# Patient Record
Sex: Female | Born: 1960
Health system: Southern US, Community
[De-identification: ages and names within clinical notes are randomized; demographics above are authoritative.]

## PROBLEM LIST (undated history)

## (undated) DIAGNOSIS — E7849 Other hyperlipidemia: Secondary | ICD-10-CM

## (undated) DIAGNOSIS — K921 Melena: Secondary | ICD-10-CM

## (undated) DIAGNOSIS — F339 Major depressive disorder, recurrent, unspecified: Secondary | ICD-10-CM

## (undated) DIAGNOSIS — I499 Cardiac arrhythmia, unspecified: Secondary | ICD-10-CM

## (undated) DIAGNOSIS — F419 Anxiety disorder, unspecified: Secondary | ICD-10-CM

## (undated) DIAGNOSIS — Z79899 Other long term (current) drug therapy: Secondary | ICD-10-CM

## (undated) DIAGNOSIS — B019 Varicella without complication: Secondary | ICD-10-CM

## (undated) DIAGNOSIS — J189 Pneumonia, unspecified organism: Secondary | ICD-10-CM

## (undated) DIAGNOSIS — G43909 Migraine, unspecified, not intractable, without status migrainosus: Secondary | ICD-10-CM

## (undated) DIAGNOSIS — I341 Nonrheumatic mitral (valve) prolapse: Secondary | ICD-10-CM

## (undated) DIAGNOSIS — M1990 Unspecified osteoarthritis, unspecified site: Secondary | ICD-10-CM

## (undated) DIAGNOSIS — T8859XA Other complications of anesthesia, initial encounter: Secondary | ICD-10-CM

## (undated) DIAGNOSIS — K219 Gastro-esophageal reflux disease without esophagitis: Secondary | ICD-10-CM

## (undated) DIAGNOSIS — K5190 Ulcerative colitis, unspecified, without complications: Secondary | ICD-10-CM

## (undated) DIAGNOSIS — T4145XA Adverse effect of unspecified anesthetic, initial encounter: Secondary | ICD-10-CM

## (undated) DIAGNOSIS — F4321 Adjustment disorder with depressed mood: Secondary | ICD-10-CM

## (undated) HISTORY — PX: COLONOSCOPY: SHX174

## (undated) HISTORY — DX: Major depressive disorder, recurrent, unspecified: F33.9

## (undated) HISTORY — PX: WISDOM TOOTH EXTRACTION: SHX21

## (undated) HISTORY — DX: Adjustment disorder with depressed mood: F43.21

## (undated) HISTORY — DX: Varicella without complication: B01.9

## (undated) HISTORY — DX: Melena: K92.1

## (undated) HISTORY — DX: Gastro-esophageal reflux disease without esophagitis: K21.9

## (undated) HISTORY — DX: Cardiac arrhythmia, unspecified: I49.9

## (undated) HISTORY — DX: Migraine, unspecified, not intractable, without status migrainosus: G43.909

## (undated) HISTORY — DX: Other long term (current) drug therapy: Z79.899

---
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
## 1999-07-21 ENCOUNTER — Other Ambulatory Visit: Admission: RE | Admit: 1999-07-21 | Discharge: 1999-07-21 | Payer: Self-pay | Admitting: Gynecology

## 2000-08-21 ENCOUNTER — Other Ambulatory Visit: Admission: RE | Admit: 2000-08-21 | Discharge: 2000-08-21 | Payer: Self-pay | Admitting: Obstetrics and Gynecology

## 2002-01-28 ENCOUNTER — Other Ambulatory Visit: Admission: RE | Admit: 2002-01-28 | Discharge: 2002-01-28 | Payer: Self-pay | Admitting: Obstetrics and Gynecology

## 2003-05-04 ENCOUNTER — Other Ambulatory Visit: Admission: RE | Admit: 2003-05-04 | Discharge: 2003-05-04 | Payer: Self-pay | Admitting: Obstetrics and Gynecology

## 2004-06-13 ENCOUNTER — Emergency Department (HOSPITAL_COMMUNITY): Admission: EM | Admit: 2004-06-13 | Discharge: 2004-06-13 | Payer: Self-pay | Admitting: Family Medicine

## 2005-06-30 ENCOUNTER — Ambulatory Visit (HOSPITAL_COMMUNITY): Admission: RE | Admit: 2005-06-30 | Discharge: 2005-06-30 | Payer: Self-pay | Admitting: Surgery

## 2005-06-30 ENCOUNTER — Encounter (INDEPENDENT_AMBULATORY_CARE_PROVIDER_SITE_OTHER): Payer: Self-pay | Admitting: *Deleted

## 2005-06-30 ENCOUNTER — Ambulatory Visit (HOSPITAL_BASED_OUTPATIENT_CLINIC_OR_DEPARTMENT_OTHER): Admission: RE | Admit: 2005-06-30 | Discharge: 2005-06-30 | Payer: Self-pay | Admitting: Surgery

## 2006-06-08 ENCOUNTER — Encounter: Admission: RE | Admit: 2006-06-08 | Discharge: 2006-06-08 | Payer: Self-pay | Admitting: Occupational Medicine

## 2008-10-09 HISTORY — PX: BREAST BIOPSY: SHX20

## 2008-11-05 ENCOUNTER — Emergency Department (HOSPITAL_COMMUNITY): Admission: EM | Admit: 2008-11-05 | Discharge: 2008-11-05 | Payer: Self-pay | Admitting: Family Medicine

## 2009-02-19 ENCOUNTER — Ambulatory Visit (HOSPITAL_COMMUNITY): Admission: RE | Admit: 2009-02-19 | Discharge: 2009-02-19 | Payer: Self-pay | Admitting: Obstetrics and Gynecology

## 2009-06-14 ENCOUNTER — Emergency Department (HOSPITAL_COMMUNITY): Admission: EM | Admit: 2009-06-14 | Discharge: 2009-06-14 | Payer: Self-pay | Admitting: Family Medicine

## 2010-05-26 ENCOUNTER — Encounter: Admission: RE | Admit: 2010-05-26 | Discharge: 2010-07-06 | Payer: Self-pay | Admitting: Physician Assistant

## 2010-06-15 ENCOUNTER — Ambulatory Visit (HOSPITAL_COMMUNITY): Admission: RE | Admit: 2010-06-15 | Discharge: 2010-06-15 | Payer: Self-pay | Admitting: Orthopedic Surgery

## 2010-06-23 ENCOUNTER — Encounter: Admission: RE | Admit: 2010-06-23 | Discharge: 2010-06-23 | Payer: Self-pay | Admitting: Obstetrics and Gynecology

## 2010-10-05 ENCOUNTER — Emergency Department (HOSPITAL_COMMUNITY)
Admission: EM | Admit: 2010-10-05 | Discharge: 2010-10-05 | Payer: Self-pay | Source: Home / Self Care | Admitting: Family Medicine

## 2010-12-12 ENCOUNTER — Other Ambulatory Visit: Payer: Self-pay | Admitting: Obstetrics and Gynecology

## 2010-12-12 DIAGNOSIS — Z09 Encounter for follow-up examination after completed treatment for conditions other than malignant neoplasm: Secondary | ICD-10-CM

## 2010-12-19 LAB — POCT URINALYSIS DIPSTICK
Bilirubin Urine: NEGATIVE
Glucose, UA: NEGATIVE mg/dL
Ketones, ur: NEGATIVE mg/dL
Nitrite: NEGATIVE
Protein, ur: NEGATIVE mg/dL
Specific Gravity, Urine: <=1.005 (ref 1.005–1.030)
Urobilinogen, UA: 0.2 mg/dL (ref 0.0–1.0)
pH: 5.5 (ref 5.0–8.0)

## 2010-12-19 LAB — URINE CULTURE
Colony Count: NO GROWTH
Culture  Setup Time: 201112281907
Culture: NO GROWTH

## 2010-12-26 ENCOUNTER — Other Ambulatory Visit: Payer: Self-pay

## 2010-12-28 ENCOUNTER — Ambulatory Visit (INDEPENDENT_AMBULATORY_CARE_PROVIDER_SITE_OTHER): Payer: 59 | Admitting: Licensed Clinical Social Worker

## 2010-12-28 DIAGNOSIS — F411 Generalized anxiety disorder: Secondary | ICD-10-CM

## 2010-12-28 DIAGNOSIS — F331 Major depressive disorder, recurrent, moderate: Secondary | ICD-10-CM

## 2011-01-10 ENCOUNTER — Other Ambulatory Visit: Payer: Self-pay

## 2011-01-11 ENCOUNTER — Other Ambulatory Visit: Payer: Self-pay

## 2011-01-13 LAB — POCT PREGNANCY, URINE: Preg Test, Ur: NEGATIVE

## 2011-01-13 LAB — POCT URINALYSIS DIP (DEVICE)
Bilirubin Urine: NEGATIVE
Glucose, UA: NEGATIVE mg/dL
Ketones, ur: NEGATIVE mg/dL
Nitrite: NEGATIVE
Protein, ur: NEGATIVE mg/dL
Specific Gravity, Urine: 1.015 (ref 1.005–1.030)
Urobilinogen, UA: 0.2 mg/dL (ref 0.0–1.0)
pH: 6.5 (ref 5.0–8.0)

## 2011-01-13 LAB — URINE CULTURE: Colony Count: 7000

## 2011-02-24 NOTE — Op Note (Signed)
Lacey Leon, Lacey Leon                ACCOUNT NO.:  [id number]   MEDICAL RECORD NO.:  [id number]          PATIENT TYPE:  AMB   LOCATION:  [place]                          FACILITY:  [facility]   PHYSICIAN:  Haywood Lasso, M.D.DATE OF BIRTH:  04-Nov-1960   DATE OF PROCEDURE:  06/30/2005  DATE OF DISCHARGE:                                 OPERATIVE REPORT   OFFICE MEDICAL RECORD NUMBER:  [facility] [phone].   PREOPERATIVE DIAGNOSIS:  Right breast mass with inconclusive core biopsy.   POSTOPERATIVE DIAGNOSIS:  Right breast mass with inconclusive core biopsy.   OPERATION:  Excision right breast mass.   SURGEON:  Haywood Lasso, M.D.   ANESTHESIA:  MAC.   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 Excisional biopsy was suggested to clarify the diagnosis.   DESCRIPTION OF PROCEDURE:  The patient was seen in the holding area, and she  had no further questions. She had confirmed and marked the right breast as  the operative site, and I also did so.   The patient was taken to the operating room, and the mass itself palpated,  identified with ultrasound and marked. The patient's was given IV sedation  and the breast prepped and draped. The time-out occurred.   I infiltrated a combination of plain 1% Xylocaine and 0.5% Marcaine mixed  equally, circumareolarly and around the mass. I made a curvilinear  circumareolar incision at the areolar margin superiorly. I was able to  palpate the mass readily and I put a suture through it for traction. Using  the cautery, I then excised it with a small rim, of what appeared to be,  normal tissue completely around it.   Once this was out, I made sure everything was dry and then closed the breast  with some 3-0 Vicryl, 4-0 Monocryl subcuticular, and Dermabond on the skin.   The patient tolerated the procedure well. There were no  operative  complications. All counts were correct.      Haywood Lasso, M.D.  Electronically Signed     CJS/MEDQ  D:  06/30/2005  T:  07/01/2005  Job:  [id number]   cc:   Sherry A. Irven [place], M.D.  Fax: [phone]   Johnnette Gourd, M.D.

## 2015-12-22 DIAGNOSIS — G43909 Migraine, unspecified, not intractable, without status migrainosus: Secondary | ICD-10-CM | POA: Insufficient documentation

## 2015-12-22 DIAGNOSIS — K5190 Ulcerative colitis, unspecified, without complications: Secondary | ICD-10-CM | POA: Insufficient documentation

## 2015-12-22 DIAGNOSIS — F411 Generalized anxiety disorder: Secondary | ICD-10-CM | POA: Insufficient documentation

## 2015-12-22 HISTORY — DX: Migraine, unspecified, not intractable, without status migrainosus: G43.909

## 2015-12-23 DIAGNOSIS — F418 Other specified anxiety disorders: Secondary | ICD-10-CM | POA: Insufficient documentation

## 2016-01-07 DIAGNOSIS — F4321 Adjustment disorder with depressed mood: Secondary | ICD-10-CM | POA: Insufficient documentation

## 2016-01-07 HISTORY — DX: Adjustment disorder with depressed mood: F43.21

## 2016-02-16 ENCOUNTER — Telehealth: Payer: Self-pay | Admitting: Gastroenterology

## 2016-02-16 NOTE — Telephone Encounter (Signed)
Called back to the patient. No answer. Left message offering 02/28/16 at 10:30 am. Ask her to call back if this appointment will be helpful to her

## 2018-10-31 ENCOUNTER — Other Ambulatory Visit: Payer: Self-pay | Admitting: *Deleted

## 2018-11-01 ENCOUNTER — Encounter: Payer: Self-pay | Admitting: Family Medicine

## 2018-11-01 ENCOUNTER — Telehealth: Payer: Self-pay | Admitting: Emergency Medicine

## 2018-11-01 ENCOUNTER — Ambulatory Visit (INDEPENDENT_AMBULATORY_CARE_PROVIDER_SITE_OTHER): Payer: No Typology Code available for payment source | Admitting: Family Medicine

## 2018-11-01 ENCOUNTER — Other Ambulatory Visit: Payer: Self-pay

## 2018-11-01 VITALS — BP 100/56 | HR 91 | Temp 98.5°F | Resp 14 | Ht 67.0 in | Wt 121.0 lb

## 2018-11-01 DIAGNOSIS — Z79899 Other long term (current) drug therapy: Secondary | ICD-10-CM

## 2018-11-01 DIAGNOSIS — K5190 Ulcerative colitis, unspecified, without complications: Secondary | ICD-10-CM

## 2018-11-01 DIAGNOSIS — R739 Hyperglycemia, unspecified: Secondary | ICD-10-CM

## 2018-11-01 DIAGNOSIS — D509 Iron deficiency anemia, unspecified: Secondary | ICD-10-CM | POA: Diagnosis not present

## 2018-11-01 DIAGNOSIS — F418 Other specified anxiety disorders: Secondary | ICD-10-CM | POA: Diagnosis not present

## 2018-11-01 DIAGNOSIS — F411 Generalized anxiety disorder: Secondary | ICD-10-CM

## 2018-11-01 DIAGNOSIS — F339 Major depressive disorder, recurrent, unspecified: Secondary | ICD-10-CM | POA: Diagnosis not present

## 2018-11-01 DIAGNOSIS — E559 Vitamin D deficiency, unspecified: Secondary | ICD-10-CM | POA: Diagnosis not present

## 2018-11-01 HISTORY — DX: Other long term (current) drug therapy: Z79.899

## 2018-11-01 HISTORY — DX: Major depressive disorder, recurrent, unspecified: F33.9

## 2018-11-01 LAB — CBC WITH DIFFERENTIAL/PLATELET
Basophils Absolute: 0 10*3/uL (ref 0.0–0.1)
Basophils Relative: 1.1 % (ref 0.0–3.0)
Eosinophils Absolute: 0.1 10*3/uL (ref 0.0–0.7)
Eosinophils Relative: 1.2 % (ref 0.0–5.0)
HCT: 41 % (ref 36.0–46.0)
Hemoglobin: 13.3 g/dL (ref 12.0–15.0)
Lymphocytes Relative: 29.3 % (ref 12.0–46.0)
Lymphs Abs: 1.3 10*3/uL (ref 0.7–4.0)
MCHC: 32.3 g/dL (ref 30.0–36.0)
MCV: 79.4 fl (ref 78.0–100.0)
Monocytes Absolute: 0.2 10*3/uL (ref 0.1–1.0)
Monocytes Relative: 4.7 % (ref 3.0–12.0)
Neutro Abs: 2.9 10*3/uL (ref 1.4–7.7)
Neutrophils Relative %: 63.7 % (ref 43.0–77.0)
Platelets: 164 10*3/uL (ref 150.0–400.0)
RBC: 5.16 Mil/uL — ABNORMAL HIGH (ref 3.87–5.11)
RDW: 13.6 % (ref 11.5–15.5)
WBC: 4.5 10*3/uL (ref 4.0–10.5)

## 2018-11-01 LAB — TSH: TSH: 0.98 u[IU]/mL (ref 0.35–4.50)

## 2018-11-01 LAB — VITAMIN D 25 HYDROXY (VIT D DEFICIENCY, FRACTURES): VITD: 38.2 ng/mL (ref 30.00–100.00)

## 2018-11-01 LAB — COMPREHENSIVE METABOLIC PANEL
ALT: 17 U/L (ref 0–35)
AST: 19 U/L (ref 0–37)
Albumin: 4.9 g/dL (ref 3.5–5.2)
Alkaline Phosphatase: 66 U/L (ref 39–117)
BUN: 11 mg/dL (ref 6–23)
CO2: 23 mEq/L (ref 19–32)
Calcium: 10 mg/dL (ref 8.4–10.5)
Chloride: 100 mEq/L (ref 96–112)
Creatinine, Ser: 0.74 mg/dL (ref 0.40–1.20)
GFR: 81.38 mL/min (ref >60.00)
Glucose, Bld: 97 mg/dL (ref 70–99)
Potassium: 4.1 mEq/L (ref 3.5–5.1)
Sodium: 134 mEq/L — ABNORMAL LOW (ref 135–145)
Total Bilirubin: 0.8 mg/dL (ref 0.2–1.2)
Total Protein: 7.7 g/dL (ref 6.0–8.3)

## 2018-11-01 LAB — HEMOGLOBIN A1C: Hgb A1c MFr Bld: 5.6 % (ref 4.6–6.5)

## 2018-11-01 MED ORDER — LORAZEPAM 1 MG PO TABS: 1.0000 mg | ORAL_TABLET | Freq: Two times a day (BID) | ORAL | 5 refills | Status: DC | PRN

## 2018-11-01 MED ORDER — SERTRALINE HCL 25 MG PO TABS: 25.0000 mg | ORAL_TABLET | Freq: Every day | ORAL | 1 refills | Status: DC

## 2018-11-01 MED FILL — SERTRALINE HCL 25 MG TABLET: 25 | 90 days supply | Qty: 90 | Fill #0

## 2018-11-01 MED FILL — LORazepam 1 MG TABS: 1 | 30 days supply | Qty: 60 | Fill #0

## 2018-11-01 NOTE — Telephone Encounter (Signed)
While patient was having labs drawn. She wanted to see if PCP will add a A1C to lab orders. She c/o of increased thirst.  Please advise. Lab did drawn additional tube for the test

## 2018-11-01 NOTE — Patient Instructions (Signed)
Please return in May 2020 for a complete physical. Please come fasting.   I have placed a referral for you to see Dr. Collene Mares again.  I have placed a referral for psychiatry as well; however, below are several resources for you to schedule for psychiatry and counseling.  It was a pleasure meeting you today! Thank you for choosing [place] to meet your healthcare needs! I truly look forward to working with you. If you have any questions or concerns, please send me a message via Mychart or call the office at [phone].  Psychiatrists  [facility] [address] [place], [place] [address] [phone]  [place], NP [address] [place], [place] [address] [place], [place] [address] [phone] [facility] Pauline Good, NP Magda Paganini NP [place], [address] [place], [place] [address] [phone]  Counseling centers only: Please call [facility] to schedule an appointment with Dr. Laroy Apple; she works at our office, or to schedule with a different female therapist.  The phone number is: [phone]  [facility] [address] [place], [place] [facility]: [facility] [address] [place] [address] [place], [place] [phone]   [facility] [address], [place] [address] [place], [place] [address] [place]  [facility] [address] [place], [place] [address] [phone]

## 2018-11-01 NOTE — Telephone Encounter (Signed)
Test added.   

## 2018-11-01 NOTE — Progress Notes (Signed)
Subjective  CC:  Chief Complaint  Patient presents with  . Establish Care    Previous pt of Dr. Redmond Pulling, last CPE was 2018  . Anxiety    Taking Zoloft and Lorazepam but reports situational problems causing increase  . Depression    Taking Zoloft and Lorazepam but reports situational problems causing increase  . Weight Loss    Clothes are not fitting well anymore  . Ulcerative Colitis    Using Canasa Suppositories, feels like she is having more flare ups    HPI: Lacey Leon is a 58 y.o. female who presents to [place] at [facility] today to establish care with me as a new patient. I have reviewed old records from prior PCP.  She has the following concerns or needs:  Mood disorder: has been treated for depression and anxiety since age 58 with zoloft and ativan multiple times per day. Reports she is very anxious. No SI or panic attacks. Never has been hospitalized for mood. Feels alone: divorced, no children, and family lives back in [place] and [place]. No social support group. She does love her job but worries her anxiety will make her lose it. Has been to psych and therapy many years ago. Ready to go again.   UC: last seen by Dr. Collene Mares in 2017. Was to have f/u but never got scheduled. Reports she is due for colonoscopy. Has frequent diarrhea. Has been losing weight; flared by anxiety.   Reports h/o chronic iron deficiency and intermittent anemia. Postmenopausal. ? Due to poor absorption. Feel fatigued and would like it rechecked. No melena. occ blood in stool  H/o thirsty a lot. No h/o diabetes.   H/o vit D deficiency. Little sunlight exposure. No leg cramps.   Dependent on benzos since use x 30 years from prior pcp. She is fearful of weaning.   Depression screen PHQ 2/9 11/01/2018  Decreased Interest 1  Down, Depressed, Hopeless 1  PHQ - 2 Score 2  Altered sleeping 1  Tired, decreased energy 1  Change in appetite 1  Feeling bad or failure about yourself  1    Trouble concentrating 1  Moving slowly or fidgety/restless 1  Suicidal thoughts 0  PHQ-9 Score 8  Difficult doing work/chores Somewhat difficult   GAD 7 : Generalized Anxiety Score 11/01/2018  Nervous, Anxious, on Edge 3  Control/stop worrying 3  Worry too much - different things 3  Trouble relaxing 3  Restless 0  Easily annoyed or irritable 1  Afraid - awful might happen 2  Total GAD 7 Score 15  Anxiety Difficulty Somewhat difficult     Assessment  1. Generalized anxiety disorder   2. Major depression, recurrent, chronic ([place])   3. Chronic prescription benzodiazepine use   4. Situational anxiety   5. Ulcerative colitis without complications, unspecified location ([place])   6. Chronic iron deficiency anemia   7. Vitamin D deficiency   8. Hyperglycemia      Plan   Mood disorder: very active. Counseling done. Needs medication adjustment. Refer to psych and for therapy.   Refer back to GI for UC care.   Check lab work and treat iron def and vit D defic if indicated.   Due cpe in may.   Follow up:  Return in about 4 months (around 03/02/2019) for complete physical. Orders Placed This Encounter  Procedures  . TSH  . Iron, TIBC and Ferritin Panel  . CBC with Differential/Platelet  . Comprehensive metabolic panel  .  VITAMIN D 25 Hydroxy (Vit-D Deficiency, Fractures)  . Hemoglobin A1c  . Ambulatory referral to Psychiatry  . Ambulatory referral to Gastroenterology   Meds ordered this encounter  Medications  . LORazepam (ATIVAN) 1 MG tablet    Sig: Take 1 tablet (1 mg total) by mouth 2 (two) times daily as needed for anxiety.    Dispense:  60 tablet    Refill:  5  . sertraline (ZOLOFT) 25 MG tablet    Sig: Take 1 tablet (25 mg total) by mouth daily.    Dispense:  90 tablet    Refill:  1       We updated and reviewed the patient's past history in detail and it is documented below.  Patient Active Problem List   Diagnosis Date Noted  . Major depression,  recurrent, chronic ([place]) 11/01/2018    On zoloft since age 48   . Chronic prescription benzodiazepine use 11/01/2018  . Situational anxiety 12/23/2015  . Generalized anxiety disorder 12/22/2015  . Migraine headache 12/22/2015  . Ulcerative colitis ([place]) 12/22/2015   Health Maintenance  Topic Date Due  . Hepatitis C Screening  [birth year]  . HIV Screening  07/02/1978  . PAP SMEAR-Modifier  07/02/1984  . MAMMOGRAM  06/24/2011  . COLONOSCOPY  11/01/2025  . TETANUS/TDAP  09/13/2028  . INFLUENZA VACCINE  Completed    There is no immunization history on file for this patient. Current Meds  Medication Sig  . LORazepam (ATIVAN) 1 MG tablet Take 1 tablet (1 mg total) by mouth 2 (two) times daily as needed for anxiety.  . mesalamine (CANASA) 1000 MG suppository Place 1,000 mg rectally at bedtime.  . sertraline (ZOLOFT) 25 MG tablet Take 1 tablet (25 mg total) by mouth daily.  . [DISCONTINUED] LORazepam (ATIVAN) 1 MG tablet Take one tablet three times a day as needed for anxiety.  . [DISCONTINUED] sertraline (ZOLOFT) 25 MG tablet Take one tablets daily for anxiety.    Allergies: Patient is allergic to clarithromycin. Past Medical History Patient  has a past medical history of Arrhythmia, Blood in stool, Chicken pox, Chronic prescription benzodiazepine use (11/01/2018), GERD (gastroesophageal reflux disease), Major depression, recurrent, chronic ([place]) (11/01/2018), Migraine, and Reaction, adjustment, with depressed mood, prolonged (01/07/2016). Past Surgical History Patient  has a past surgical history that includes Breast biopsy (2010). Family History: Patient family history includes Depression in her mother; Heart disease in her father. Social History:  Patient  reports that she has never smoked. She has never used smokeless tobacco. She reports that she does not drink alcohol or use drugs.  Review of Systems: Constitutional: negative for fever or malaise Ophthalmic: negative for  photophobia, double vision or loss of vision Cardiovascular: negative for chest pain, dyspnea on exertion, or new LE swelling Respiratory: negative for SOB or persistent cough Gastrointestinal: negative for abdominal pain, change in bowel habits or melena Genitourinary: negative for dysuria or gross hematuria Musculoskeletal: negative for new gait disturbance or muscular weakness Integumentary: negative for new or persistent rashes Neurological: negative for TIA or stroke symptoms Psychiatric: negative for SI or delusions Allergic/Immunologic: negative for hives  Patient Care Team    Relationship Specialty Notifications Start End  Leamon Arnt, MD PCP - General Family Medicine  11/01/18   Juanita Craver, MD Consulting Physician Gastroenterology  11/01/18   [facility]    11/01/18     Objective  Vitals: BP (!) 100/56   Pulse 91   Temp 98.5 F (36.9 C) (Oral)  Resp 14   Ht 5' 7"  (8.850 m)   Wt 121 lb (54.9 kg)   SpO2 98%   BMI 18.95 kg/m  General:  Well developed, well nourished, no acute distress but tearful throughout interview Psych:  Alert and oriented,normal mood and affect HEENT:  Normocephalic, atraumatic, non-icteric sclera, PERRL, oropharynx is without mass or exudate, supple neck without adenopathy, mass or thyromegaly Cardiovascular:  RRR without gallop, rub or murmur, nondisplaced PMI Respiratory:  Good breath sounds bilaterally, CTAB with normal respiratory effort Gastrointestinal: normal bowel sounds, soft, non-tender, no noted masses. No HSM MSK: no deformities, contusions. Joints are without erythema or swelling Skin:  Warm, no rashes or suspicious lesions noted Neurologic:    Mental status is normal. Gross motor and sensory exams are normal. Normal gait   Commons side effects, risks, benefits, and alternatives for medications and treatment plan prescribed today were discussed, and the patient expressed understanding of the given instructions.  Patient is instructed to call or message via MyChart if he/she has any questions or concerns regarding our treatment plan. No barriers to understanding were identified. We discussed Red Flag symptoms and signs in detail. Patient expressed understanding regarding what to do in case of urgent or emergency type symptoms.   Medication list was reconciled, printed and provided to the patient in AVS. Patient instructions and summary information was reviewed with the patient as documented in the AVS. This note was prepared with assistance of Dragon voice recognition software. Occasional wrong-word or sound-a-like substitutions may have occurred due to the inherent limitations of voice recognition software

## 2018-11-01 NOTE — Telephone Encounter (Signed)
Sure. thanks

## 2018-11-02 LAB — IRON,TIBC AND FERRITIN PANEL
%SAT: 54 % (calc) — ABNORMAL HIGH (ref 16–45)
Ferritin: 43 ng/mL (ref 16–232)
Iron: 212 ug/dL — ABNORMAL HIGH (ref 45–160)
TIBC: 395 mcg/dL (calc) (ref 250–450)

## 2018-11-04 ENCOUNTER — Encounter: Payer: Self-pay | Admitting: Family Medicine

## 2018-11-06 ENCOUNTER — Encounter: Payer: Self-pay | Admitting: Family Medicine

## 2018-11-07 MED FILL — MESALAMINE 1000 MG SUPP: 1000 | 30 days supply | Qty: 30 | Fill #0

## 2018-12-06 ENCOUNTER — Ambulatory Visit: Payer: No Typology Code available for payment source | Admitting: Psychology

## 2018-12-09 ENCOUNTER — Encounter: Payer: Self-pay | Admitting: Family Medicine

## 2018-12-20 MED FILL — SERTRALINE HCL 100 MG TAB: 100 | 30 days supply | Qty: 30 | Fill #0 | Status: TO

## 2018-12-24 MED FILL — LORazepam 1 MG TABS: 1 | 30 days supply | Qty: 60 | Fill #1 | Status: TO

## 2018-12-30 ENCOUNTER — Ambulatory Visit: Payer: Self-pay | Admitting: *Deleted

## 2018-12-30 NOTE — Telephone Encounter (Signed)
Pt called stating that she has a cough, and intermittent chest discomfort due to coughing;she has taken mucinex; she also has post nasal drip; her secretions are clear; her eyes have been watering; the pt feels like her symptoms are related to her allergies; however, she was told to contact her PCP, the pt says "I feel fine"; she says that her cough starts when she talks a lot;   Recommendations made per nurse triage protocol; she verbalizes understanding, she does not want to come into the office, but she needs a note for work; please contact the pt at [phone], and a message can be left on the voicemail; the pt is seen by Dr Jonni Sanger, [facility]; will route to office for notification. Reason for Disposition . Cough with cold symptoms (e.g., runny nose, postnasal drip, throat clearing)  Answer Assessment - Initial Assessment Questions 1. ONSET: "When did the cough begin?"      12/22/21 2. SEVERITY: "How bad is the cough today?"      Mild; worse when talking 3. RESPIRATORY DISTRESS: "Describe your breathing."      no 4. FEVER: "Do you have a fever?" If so, ask: "What is your temperature, how was it measured, and when did it start?"     no 5. SPUTUM: "Describe the color of your sputum" (clear, white, yellow, green)     clear 6. HEMOPTYSIS: "Are you coughing up any blood?" If so ask: "How much?" (flecks, streaks, tablespoons, etc.)     no 7. CARDIAC HISTORY: "Do you have any history of heart disease?" (e.g., heart attack, congestive heart failure)      MVP 8. LUNG HISTORY: "Do you have any history of lung disease?"  (e.g., pulmonary embolus, asthma, emphysema)     no 9. PE RISK FACTORS: "Do you have a history of blood clots?" (or: recent major surgery, recent prolonged travel, bedridden)     no 10. OTHER SYMPTOMS: "Do you have any other symptoms?" (e.g., runny nose, wheezing, chest pain)       Eyes watering, post nasal drip, nasal congestion (takes flonase) 11. PREGNANCY: "Is there any  chance you are pregnant?" "When was your last menstrual period?"       No, no 12. TRAVEL: "Have you traveled out of the country in the last month?" (e.g., travel history, exposures)       No, no  Protocols used: COUGH - ACUTE PRODUCTIVE-A-AH

## 2018-12-30 NOTE — Telephone Encounter (Signed)
Please advise on how to proceed as pt refused appt

## 2018-12-31 NOTE — Telephone Encounter (Signed)
Called to f/u with pt, she report she has tried mucinex and zyrtex. She is feeling better and will c/b if she symptoms get worse.

## 2018-12-31 NOTE — Telephone Encounter (Signed)
She has respiratory symptoms.  Unclear if allergies or infection. Given current COVID-19 pandemic: Recommend staying home; start allergy medications: zyrtec and flonase (both are otc) and may use generics.  Can use delsym if needed for cough.   Call if having fever or SOB.

## 2019-01-14 ENCOUNTER — Encounter: Payer: Self-pay | Admitting: Family Medicine

## 2019-01-31 ENCOUNTER — Ambulatory Visit: Payer: 59 | Admitting: Family Medicine

## 2019-02-07 ENCOUNTER — Encounter: Payer: No Typology Code available for payment source | Admitting: Family Medicine

## 2019-02-10 MED FILL — LORazepam 1 MG TABS: 1 | 30 days supply | Qty: 60 | Fill #0 | Status: TO

## 2019-02-10 MED FILL — SERTRALINE HCL 100 MG TAB: 100 | 30 days supply | Qty: 30 | Fill #0

## 2019-03-13 MED FILL — LORazepam 1 MG TABS: 1 | 30 days supply | Qty: 60 | Fill #0

## 2019-03-13 MED FILL — SERTRALINE HCL 100 MG TAB: 100 | 90 days supply | Qty: 90 | Fill #0

## 2019-06-05 ENCOUNTER — Telehealth: Payer: Self-pay

## 2019-06-05 ENCOUNTER — Other Ambulatory Visit: Payer: Self-pay

## 2019-06-05 DIAGNOSIS — H43393 Other vitreous opacities, bilateral: Secondary | ICD-10-CM

## 2019-06-05 DIAGNOSIS — H538 Other visual disturbances: Secondary | ICD-10-CM

## 2019-06-05 NOTE — Telephone Encounter (Signed)
Left voicemail message for patient to return call (need to know reason for referral)

## 2019-06-05 NOTE — Telephone Encounter (Signed)
Referral done

## 2019-06-05 NOTE — Telephone Encounter (Signed)
Left detailed voicemail message requesting a call back from pt.  Need to know reason why she needs referral to ophthamology.  Routine eye exam vs medical issue.

## 2019-06-05 NOTE — Telephone Encounter (Signed)
Copied from [place] [phone]. Topic: Referral - Request for Referral >> Jun 04, 2019  5:16 PM Alanda Slim E wrote: Has patient seen PCP for this complaint?  *If NO, is insurance requiring patient see PCP for this issue before PCP can refer them? Yes  Referral for which specialty: opthalmology  Preferred provider/office: Dr. Katy Fitch with [facility]/ phone: [phone] Reason for referral: Pt needs referral due to insurance and Pt has been scheduled for 9.8.2020/ Pt needs referral before her appt date/ please advise

## 2019-06-05 NOTE — Telephone Encounter (Signed)
Patient called to say that the reason she need to see the ophthalmologist is because of blurred vision and an increase in floaters. Ph# [phone]

## 2019-06-05 NOTE — Telephone Encounter (Signed)
Noted  

## 2019-06-13 MED FILL — SERTRALINE HCL 100 MG TAB: 100 | 90 days supply | Qty: 90 | Fill #0

## 2019-06-20 ENCOUNTER — Ambulatory Visit (INDEPENDENT_AMBULATORY_CARE_PROVIDER_SITE_OTHER): Payer: No Typology Code available for payment source | Admitting: Family Medicine

## 2019-06-20 ENCOUNTER — Encounter: Payer: Self-pay | Admitting: Family Medicine

## 2019-06-20 ENCOUNTER — Other Ambulatory Visit (HOSPITAL_COMMUNITY)
Admission: RE | Admit: 2019-06-20 | Discharge: 2019-06-20 | Disposition: A | Discharge status: Home / Self Care | Payer: No Typology Code available for payment source | Source: Ambulatory Visit | Attending: Family Medicine | Admitting: Family Medicine

## 2019-06-20 ENCOUNTER — Other Ambulatory Visit: Payer: Self-pay

## 2019-06-20 VITALS — BP 98/66 | HR 81 | Temp 98.0°F | Resp 16 | Ht 66.25 in | Wt 121.0 lb

## 2019-06-20 DIAGNOSIS — Z124 Encounter for screening for malignant neoplasm of cervix: Secondary | ICD-10-CM | POA: Diagnosis present

## 2019-06-20 DIAGNOSIS — Q8740 Marfan's syndrome, unspecified: Secondary | ICD-10-CM

## 2019-06-20 DIAGNOSIS — I059 Rheumatic mitral valve disease, unspecified: Secondary | ICD-10-CM | POA: Diagnosis not present

## 2019-06-20 DIAGNOSIS — F411 Generalized anxiety disorder: Secondary | ICD-10-CM

## 2019-06-20 DIAGNOSIS — K5190 Ulcerative colitis, unspecified, without complications: Secondary | ICD-10-CM

## 2019-06-20 DIAGNOSIS — Z0000 Encounter for general adult medical examination without abnormal findings: Secondary | ICD-10-CM | POA: Diagnosis present

## 2019-06-20 DIAGNOSIS — F339 Major depressive disorder, recurrent, unspecified: Secondary | ICD-10-CM

## 2019-06-20 LAB — COMPREHENSIVE METABOLIC PANEL
ALT: 14 U/L (ref 0–35)
AST: 18 U/L (ref 0–37)
Albumin: 4.8 g/dL (ref 3.5–5.2)
Alkaline Phosphatase: 53 U/L (ref 39–117)
BUN: 15 mg/dL (ref 6–23)
CO2: 27 mEq/L (ref 19–32)
Calcium: 9.6 mg/dL (ref 8.4–10.5)
Chloride: 100 mEq/L (ref 96–112)
Creatinine, Ser: 0.79 mg/dL (ref 0.40–1.20)
GFR: 75.29 mL/min (ref >60.00)
Glucose, Bld: 89 mg/dL (ref 70–99)
Potassium: 4.1 mEq/L (ref 3.5–5.1)
Sodium: 135 mEq/L (ref 135–145)
Total Bilirubin: 0.5 mg/dL (ref 0.2–1.2)
Total Protein: 7.6 g/dL (ref 6.0–8.3)

## 2019-06-20 LAB — SEDIMENTATION RATE: Sed Rate: 31 mm/hr — ABNORMAL HIGH (ref 0–30)

## 2019-06-20 LAB — POCT URINALYSIS DIPSTICK
Bilirubin, UA: NEGATIVE
Blood, UA: POSITIVE
Glucose, UA: NEGATIVE
Ketones, UA: NEGATIVE
Leukocytes, UA: NEGATIVE
Nitrite, UA: NEGATIVE
Protein, UA: NEGATIVE
Spec Grav, UA: 1.015 (ref 1.010–1.025)
Urobilinogen, UA: 0.2 E.U./dL
pH, UA: 6 (ref 5.0–8.0)

## 2019-06-20 LAB — B12 AND FOLATE PANEL
Folate: 17.5 ng/mL (ref >5.9)
Vitamin B-12: 427 pg/mL (ref 211–911)

## 2019-06-20 LAB — LIPID PANEL
Cholesterol: 326 mg/dL — ABNORMAL HIGH (ref 0–200)
HDL: 119 mg/dL (ref >39.00)
LDL Cholesterol: 197 mg/dL — ABNORMAL HIGH (ref 0–99)
NonHDL: 206.81
Total CHOL/HDL Ratio: 3
Triglycerides: 48 mg/dL (ref 0.0–149.0)
VLDL: 9.6 mg/dL (ref 0.0–40.0)

## 2019-06-20 LAB — CBC WITH DIFFERENTIAL/PLATELET
Basophils Absolute: 0 10*3/uL (ref 0.0–0.1)
Basophils Relative: 0.8 % (ref 0.0–3.0)
Eosinophils Absolute: 0.1 10*3/uL (ref 0.0–0.7)
Eosinophils Relative: 1.8 % (ref 0.0–5.0)
HCT: 39.1 % (ref 36.0–46.0)
Hemoglobin: 12.7 g/dL (ref 12.0–15.0)
Lymphocytes Relative: 38.1 % (ref 12.0–46.0)
Lymphs Abs: 1.4 10*3/uL (ref 0.7–4.0)
MCHC: 32.4 g/dL (ref 30.0–36.0)
MCV: 77.8 fl — ABNORMAL LOW (ref 78.0–100.0)
Monocytes Absolute: 0.2 10*3/uL (ref 0.1–1.0)
Monocytes Relative: 5.6 % (ref 3.0–12.0)
Neutro Abs: 2 10*3/uL (ref 1.4–7.7)
Neutrophils Relative %: 53.7 % (ref 43.0–77.0)
Platelets: 155 10*3/uL (ref 150.0–400.0)
RBC: 5.03 Mil/uL (ref 3.87–5.11)
RDW: 13.9 % (ref 11.5–15.5)
WBC: 3.7 10*3/uL — ABNORMAL LOW (ref 4.0–10.5)

## 2019-06-20 LAB — VITAMIN D 25 HYDROXY (VIT D DEFICIENCY, FRACTURES): VITD: 49.48 ng/mL (ref 30.00–100.00)

## 2019-06-20 LAB — TSH: TSH: 1.67 u[IU]/mL (ref 0.35–4.50)

## 2019-06-20 NOTE — Progress Notes (Signed)
Subjective  Chief Complaint  Patient presents with  . Annual Exam    Fasting, Wants to discuss marfans and getting checked  . Nail Problem    Bilateral feet, has had for over a year    HPI: Lacey Leon is a 58 y.o. female who presents to [place] [facility] today for a Female Wellness Visit. She also has the concerns and/or needs as listed above in the chief complaint. These will be addressed in addition to the Health Maintenance Visit.   Wellness Visit: annual visit with health maintenance review and exam with Pap   HM: pt due mammogram but adamantly refuses due to pain. Pap today. Refuses vaccinations as well. Eats healthy; walks for exercise. Feeling well Chronic disease f/u and/or acute problem visit: (deemed necessary to be done in addition to the wellness visit):  GAD/depression: see L. Dorethea Clan, NP and doing well on sertraline and benzo. Feels well! Believes sunny weather helps her as well. No sxs of depression.   UC: reviewed records from Jan 2020. Doing well. Not on meds now. Declines f/u colonoscopy - aware of recommendations.   Eye findings: reports Dr. Katy Fitch mentioned he is concerned about marfan's. I will get the records. 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 She is tall with a wide arm span. No FH of marfan's. She strongly does not want genetic testing or counseling.   Assessment  1. Annual physical exam   2. Generalized anxiety disorder   3. Ulcerative colitis without complications, unspecified location ([place])   4. Cervical cancer screening   5. Mitral valve disorder   6. Marfan syndrome   7. Major depression, recurrent, chronic [facility])      Plan  Female Wellness Visit:  Age appropriate Health Maintenance and Prevention measures were discussed with patient. Included topics are cancer screening recommendations, ways to keep healthy (see AVS) including dietary and exercise recommendations, regular eye and dental care, use of seat belts, and  avoidance of moderate alcohol use and tobacco use. Encouraged to get mammogram and colonoscopy.pt refuses. Will monitor.   BMI: discussed patient's BMI and encouraged positive lifestyle modifications to help get to or maintain a target BMI.  HM needs and immunizations were addressed and ordered. See below for orders. See HM and immunization section for updates.refuses vaccines  Routine labs and screening tests ordered including cmp, cbc and lipids where appropriate.  Discussed recommendations regarding Vit D and calcium supplementation (see AVS)  Chronic disease management visit and/or acute problem visit:  ? Marfan's syndrome: she has many of the clinical findings. Will get Dr. Zenia Resides records, check cardiac echocardiogram. Educated.   UC: pt is doing well. rec f/u annually with Dr. Collene Mares  Depression/anxiety: now well controlled.  Follow up: Return in about 6 months (around 12/18/2019) for recheck.  Orders Placed This Encounter  Procedures  . CBC with Differential/Platelet  . Comprehensive metabolic panel  . Lipid panel  . Hepatitis C antibody  . TSH  . Sedimentation rate  . B12 and Folate Panel  . Iron, TIBC and Ferritin Panel  . VITAMIN D 25 Hydroxy (Vit-D Deficiency, Fractures)  . POCT urinalysis dipstick  . ECHOCARDIOGRAM COMPLETE   No orders of the defined types were placed in this encounter.     Lifestyle: Body mass index is 19.38 kg/m. Wt Readings from Last 3 Encounters:  06/20/19 121 lb (54.9 kg)  11/01/18 121 lb (54.9 kg)     Patient Active Problem List   Diagnosis  Date Noted  . Major depression, recurrent, chronic ([place]) 11/01/2018    On zoloft since age 35   . Chronic prescription benzodiazepine use 11/01/2018  . Situational anxiety 12/23/2015  . Generalized anxiety disorder 12/22/2015  . Migraine headache 12/22/2015  . Ulcerative colitis ([place]) 12/22/2015   Health Maintenance  Topic Date Due  . Hepatitis C Screening  01/28/1961  . PAP  SMEAR-Modifier  07/02/1993  . MAMMOGRAM  06/19/2020 (Originally 06/24/2011)  . COLONOSCOPY  11/01/2025  . TETANUS/TDAP  09/13/2028  . INFLUENZA VACCINE  Discontinued  . HIV Screening  Discontinued    There is no immunization history on file for this patient. We updated and reviewed the patient's past history in detail and it is documented below. Allergies: Patient is allergic to clarithromycin. Past Medical History Patient  has a past medical history of Arrhythmia, Blood in stool, Chicken pox, Chronic prescription benzodiazepine use (11/01/2018), GERD (gastroesophageal reflux disease), Major depression, recurrent, chronic ([place]) (11/01/2018), Migraine, and Reaction, adjustment, with depressed mood, prolonged (01/07/2016). Past Surgical History Patient  has a past surgical history that includes Breast biopsy (2010). Family History: Patient family history includes Depression in her mother; Heart disease in her father. Social History:  Patient  reports that she has never smoked. She has never used smokeless tobacco. She reports that she does not drink alcohol or use drugs.  Review of Systems: Constitutional: negative for fever or malaise Ophthalmic: negative for photophobia, double vision or loss of vision Cardiovascular: negative for chest pain, dyspnea on exertion, or new LE swelling Respiratory: negative for SOB or persistent cough Gastrointestinal: negative for abdominal pain, change in bowel habits or melena Genitourinary: negative for dysuria or gross hematuria, no abnormal uterine bleeding or disharge Musculoskeletal: negative for new gait disturbance or muscular weakness Integumentary: negative for new or persistent rashes, no breast lumps Neurological: negative for TIA or stroke symptoms Psychiatric: negative for SI or delusions Allergic/Immunologic: negative for hives  Patient Care Team    Relationship Specialty Notifications Start End  Leamon Arnt, MD PCP - General Family  Medicine  11/01/18   Juanita Craver, MD Consulting Physician Gastroenterology  11/01/18   [facility]    11/01/18     Objective  Vitals: BP 98/66   Pulse 81   Temp 98 F (36.7 C) (Tympanic)   Resp 16   Ht 5' 6.25" (1.683 m)   Wt 121 lb (54.9 kg)   LMP  (Exact Date)   SpO2 98%   BMI 19.38 kg/m   Measured arm span: 5'6.25"General:  Well developed, well nourished, no acute distress , thin Psych:  Alert and orientedx3,normal mood and affect HEENT:  Normocephalic, atraumatic, non-icteric sclera, PERRL, oropharynx is clear without mass or exudate, supple neck without adenopathy, mass or thyromegaly Cardiovascular:  Normal S1, S2, RRR without gallop, rub or murmur, nondisplaced PMI, high arched palate Respiratory:  Good breath sounds bilaterally, CTAB with normal respiratory effort Gastrointestinal: normal bowel sounds, soft, non-tender, no noted masses. No HSM MSK: no deformities, contusions. Joints are without erythema or swelling. Spine and CVA region are nontender. +pectus excavatum, mild; + thumb sign, + wrist sign Skin:  Warm, no rashes or suspicious lesions noted Neurologic:    Mental status is normal. CN 2-11 are normal. Gross motor and sensory exams are normal. Normal gait. No tremor Breast Exam: No mass, skin retraction or nipple discharge is appreciated in either breast. No axillary adenopathy. Fibrocystic changes are not noted Pelvic Exam: Normal external genitalia, no vulvar or  vaginal lesions present. Clear nullip cervix w/o CMT. Bimanual exam reveals a nontender fundus w/o masses, nl size. No adnexal masses present. No inguinal adenopathy. A PAP smear was performed.    Commons side effects, risks, benefits, and alternatives for medications and treatment plan prescribed today were discussed, and the patient expressed understanding of the given instructions. Patient is instructed to call or message via MyChart if he/she has any questions or concerns regarding our  treatment plan. No barriers to understanding were identified. We discussed Red Flag symptoms and signs in detail. Patient expressed understanding regarding what to do in case of urgent or emergency type symptoms.   Medication list was reconciled, printed and provided to the patient in AVS. Patient instructions and summary information was reviewed with the patient as documented in the AVS. This note was prepared with assistance of Dragon voice recognition software. Occasional wrong-word or sound-a-like substitutions may have occurred due to the inherent limitations of voice recognition software

## 2019-06-20 NOTE — Patient Instructions (Addendum)
Please return in 6 months for recheck.   You may have marfan's syndrome or a variation of it.  I've ordered an ultrasound of your heart called and echocardiogram. We will call you with an appointment. This is an important test to ensure your heart and blood vessels from the heart are normal.   If you have any questions or concerns, please don't hesitate to send me a message via MyChart or call the office at [phone]. Thank you for visiting with [place] today! It's our pleasure caring for you.   Preventive Care 38-35 Years Old, Female Preventive care refers to visits with your health care provider and lifestyle choices that can promote health and wellness. This includes:  A yearly physical exam. This may also be called an annual well check.  Regular dental visits and eye exams.  Immunizations.  Screening for certain conditions.  Healthy lifestyle choices, such as eating a healthy diet, getting regular exercise, not using drugs or products that contain nicotine and tobacco, and limiting alcohol use. What can I expect for my preventive care visit? Physical exam Your health care provider will check your:  Height and weight. This may be used to calculate body mass index (BMI), which tells if you are at a healthy weight.  Heart rate and blood pressure.  Skin for abnormal spots. Counseling Your health care provider may ask you questions about your:  Alcohol, tobacco, and drug use.  Emotional well-being.  Home and relationship well-being.  Sexual activity.  Eating habits.  Work and work Statistician.  Method of birth control.  Menstrual cycle.  Pregnancy history. What immunizations do I need?  Influenza (flu) vaccine  This is recommended every year. Tetanus, diphtheria, and pertussis (Tdap) vaccine  You may need a Td booster every 10 years. Varicella (chickenpox) vaccine  You may need this if you have not been vaccinated. Zoster (shingles) vaccine  You may need  this after age 84. Measles, mumps, and rubella (MMR) vaccine  You may need at least one dose of MMR if you were born in 1957 or later. You may also need a second dose. Pneumococcal conjugate (PCV13) vaccine  You may need this if you have certain conditions and were not previously vaccinated. Pneumococcal polysaccharide (PPSV23) vaccine  You may need one or two doses if you smoke cigarettes or if you have certain conditions. Meningococcal conjugate (MenACWY) vaccine  You may need this if you have certain conditions. Hepatitis A vaccine  You may need this if you have certain conditions or if you travel or work in places where you may be exposed to hepatitis A. Hepatitis B vaccine  You may need this if you have certain conditions or if you travel or work in places where you may be exposed to hepatitis B. Haemophilus influenzae type b (Hib) vaccine  You may need this if you have certain conditions. Human papillomavirus (HPV) vaccine  If recommended by your health care provider, you may need three doses over 6 months. You may receive vaccines as individual doses or as more than one vaccine together in one shot (combination vaccines). Talk with your health care provider about the risks and benefits of combination vaccines. What tests do I need? Blood tests  Lipid and cholesterol levels. These may be checked every 5 years, or more frequently if you are over 87 years old.  Hepatitis C test.  Hepatitis B test. Screening  Lung cancer screening. You may have this screening every year starting at age 66 if you  have a 30-pack-year history of smoking and currently smoke or have quit within the past 15 years.  Colorectal cancer screening. All adults should have this screening starting at age 17 and continuing until age 96. Your health care provider may recommend screening at age 59 if you are at increased risk. You will have tests every 1-10 years, depending on your results and the type of  screening test.  Diabetes screening. This is done by checking your blood sugar (glucose) after you have not eaten for a while (fasting). You may have this done every 1-3 years.  Mammogram. This may be done every 1-2 years. Talk with your health care provider about when you should start having regular mammograms. This may depend on whether you have a family history of breast cancer.  BRCA-related cancer screening. This may be done if you have a family history of breast, ovarian, tubal, or peritoneal cancers.  Pelvic exam and Pap test. This may be done every 3 years starting at age 61. Starting at age 57, this may be done every 5 years if you have a Pap test in combination with an HPV test. Other tests  Sexually transmitted disease (STD) testing.  Bone density scan. This is done to screen for osteoporosis. You may have this scan if you are at high risk for osteoporosis. Follow these instructions at home: Eating and drinking  Eat a diet that includes fresh fruits and vegetables, whole grains, lean protein, and low-fat dairy.  Take vitamin and mineral supplements as recommended by your health care provider.  Do not drink alcohol if: ? Your health care provider tells you not to drink. ? You are pregnant, may be pregnant, or are planning to become pregnant.  If you drink alcohol: ? Limit how much you have to 0-1 drink a day. ? Be aware of how much alcohol is in your drink. In the [place] one drink equals one 12 oz bottle of beer (355 mL), one 5 oz glass of wine (148 mL), or one 1 oz glass of hard liquor (44 mL). Lifestyle  Take daily care of your teeth and gums.  Stay active. Exercise for at least 30 minutes on 5 or more days each week.  Do not use any products that contain nicotine or tobacco, such as cigarettes, e-cigarettes, and chewing tobacco. If you need help quitting, ask your health care provider.  If you are sexually active, practice safe sex. Use a condom or other form of birth  control (contraception) in order to prevent pregnancy and STIs (sexually transmitted infections).  If told by your health care provider, take low-dose aspirin daily starting at age 57. What's next?  Visit your health care provider once a year for a well check visit.  Ask your health care provider how often you should have your eyes and teeth checked.  Stay up to date on all vaccines. This information is not intended to replace advice given to you by your health care provider. Make sure you discuss any questions you have with your health care provider. Document Released: 10/22/2015 Document Revised: 06/06/2018 Document Reviewed: 06/06/2018 Elsevier Patient Education  2020 [employer].

## 2019-06-21 LAB — CYTOLOGY - PAP
Diagnosis: NEGATIVE
HPV: NOT DETECTED

## 2019-06-23 ENCOUNTER — Encounter: Payer: Self-pay | Admitting: Family Medicine

## 2019-06-23 DIAGNOSIS — E7849 Other hyperlipidemia: Secondary | ICD-10-CM

## 2019-06-23 HISTORY — DX: Other hyperlipidemia: E78.49

## 2019-06-23 LAB — HEPATITIS C ANTIBODY
Hepatitis C Ab: NONREACTIVE
SIGNAL TO CUT-OFF: 0.01 (ref <1.00)

## 2019-06-23 LAB — IRON,TIBC AND FERRITIN PANEL
%SAT: 23 % (calc) (ref 16–45)
Ferritin: 57 ng/mL (ref 16–232)
Iron: 77 ug/dL (ref 45–160)
TIBC: 328 mcg/dL (calc) (ref 250–450)

## 2019-06-23 LAB — HIV ANTIBODY (ROUTINE TESTING W REFLEX): HIV 1&2 Ab, 4th Generation: NONREACTIVE

## 2019-06-26 ENCOUNTER — Other Ambulatory Visit (HOSPITAL_COMMUNITY): Payer: No Typology Code available for payment source

## 2019-07-08 ENCOUNTER — Other Ambulatory Visit: Payer: Self-pay

## 2019-07-08 ENCOUNTER — Ambulatory Visit (HOSPITAL_COMMUNITY): Payer: No Typology Code available for payment source | Attending: Cardiology

## 2019-07-08 DIAGNOSIS — Q8740 Marfan's syndrome, unspecified: Secondary | ICD-10-CM | POA: Diagnosis present

## 2019-07-08 DIAGNOSIS — I059 Rheumatic mitral valve disease, unspecified: Secondary | ICD-10-CM | POA: Diagnosis present

## 2019-07-09 ENCOUNTER — Encounter: Payer: Self-pay | Admitting: Family Medicine

## 2019-07-09 DIAGNOSIS — R2991 Unspecified symptoms and signs involving the musculoskeletal system: Secondary | ICD-10-CM | POA: Insufficient documentation

## 2019-07-22 ENCOUNTER — Telehealth: Payer: Self-pay | Admitting: Family Medicine

## 2019-07-22 NOTE — Telephone Encounter (Signed)
Please advise 

## 2019-07-22 NOTE — Telephone Encounter (Signed)
Copied from [place] [id number]. Topic: General - Other >> Jul 22, 2019  2:43 PM Leward Quan A wrote: Reason for CRM: Patient called to inform Dr Jonni Sanger that she may be laid off her job on 08/16/2019. She also want to see a dermatologist for moles on her upper body that are getting bigger and she is concerned want to be checked before insurance runs out. Also states that she need a note to take to her job that she cannot take the flu shot since the last time she had it very sick and does not want to go through that experience again. Please contact patient at Ph# [phone]

## 2019-07-23 ENCOUNTER — Encounter: Payer: Self-pay | Admitting: *Deleted

## 2019-07-23 NOTE — Telephone Encounter (Signed)
Pt has been scheduled.  °

## 2019-07-23 NOTE — Telephone Encounter (Signed)
I will be happy to check her moles. Doubtful we can get her in to see derm that quickly for a mole check.  Please give note that patient declines flu vaccine due to prior adverse reaction to it.

## 2019-07-29 ENCOUNTER — Telehealth: Payer: Self-pay | Admitting: Physical Therapy

## 2019-07-29 ENCOUNTER — Encounter: Payer: Self-pay | Admitting: *Deleted

## 2019-07-29 NOTE — Telephone Encounter (Signed)
MyChart message sent to pt to inform her she can email, fax, or bring the form to the office.

## 2019-07-29 NOTE — Telephone Encounter (Signed)
Copied from [place] [id number]. Topic: General - Other >> Jul 28, 2019  5:55 PM Rutherford Nail, NT wrote: Reason for CRM: Patient calling and states that she needs to send Dr Jonni Sanger a form regarding her flu shot. States that she cannot fax it. Please advise.

## 2019-07-31 ENCOUNTER — Encounter: Payer: Self-pay | Admitting: *Deleted

## 2019-08-01 ENCOUNTER — Other Ambulatory Visit: Payer: Self-pay

## 2019-08-01 ENCOUNTER — Ambulatory Visit (INDEPENDENT_AMBULATORY_CARE_PROVIDER_SITE_OTHER): Payer: No Typology Code available for payment source | Admitting: Family Medicine

## 2019-08-01 ENCOUNTER — Encounter: Payer: Self-pay | Admitting: Family Medicine

## 2019-08-01 VITALS — BP 98/66 | HR 75 | Temp 97.2°F | Resp 14 | Ht 66.25 in | Wt 122.8 lb

## 2019-08-01 DIAGNOSIS — D229 Melanocytic nevi, unspecified: Secondary | ICD-10-CM

## 2019-08-01 DIAGNOSIS — R739 Hyperglycemia, unspecified: Secondary | ICD-10-CM | POA: Diagnosis not present

## 2019-08-01 MED FILL — LORazepam 1 MG TABS: 1 | 90 days supply | Qty: 90 | Fill #0

## 2019-08-01 NOTE — Patient Instructions (Signed)
Please follow up as scheduled for your next visit with me: 12/22/2019   If you have any questions or concerns, please don't hesitate to send me a message via MyChart or call the office at [phone]. Thank you for visiting with [place] today! It's our pleasure caring for you.  I will release your lab results to you on your MyChart account with further instructions. Please reply with any questions.

## 2019-08-01 NOTE — Progress Notes (Signed)
Subjective  CC:  Chief Complaint  Patient presents with  . Nevus    Presents for mole check, she reports moles are from hips up.. She states that she has a mole on her left side that has been red and raised  . Labs Only    Wants to have a fasting insulin check today    HPI: Lacey Leon is a 58 y.o. female who presents to the office today to address the problems listed above in the chief complaint.  58 yo with many moles on upper torso. No h/o precancerous or cancerous lesions. Says grandfather had skin cancer but doesn't know details. Would like hers checked. Has one on left chest that at times itches. No bleeding   Requesting insulin level. 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 Pt worries about her glucose metabolism in spite of recent nl fasting glucose and a1c: says "but they are high normal".   Assessment  1. Numerous moles   2. Hyperglycemia      Plan   moles:  Benign appearing  H/o hyperglycemia: will check random insulin level. She does not likely have insulin resistance. Education given.   Follow up: prn  12/22/2019  Orders Placed This Encounter  Procedures  . Insulin, random   No orders of the defined types were placed in this encounter.     I reviewed the patients updated PMH, FH, and SocHx.    Patient Active Problem List   Diagnosis Date Noted  . 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 Familial hyperlipidemia 06/23/2019  . Major depression, recurrent, chronic ([place]) 11/01/2018  . Chronic prescription benzodiazepine use 11/01/2018  . Situational anxiety 12/23/2015  . Generalized anxiety disorder 12/22/2015  . Ulcerative colitis ([place]) 12/22/2015   Current Meds  Medication Sig  . LORazepam (ATIVAN) 1 MG tablet Take 1 tablet (1 mg total) by mouth 2 (two) times daily as needed for anxiety.  . mesalamine (CANASA) 1000 MG suppository Place 1,000 mg rectally at bedtime.  . sertraline (ZOLOFT) 100 MG tablet Take 1 tablet by mouth daily.     Allergies: Patient is allergic to clarithromycin. Family History: Patient family history includes Depression in her mother; Heart disease in her father. Social History:  Patient  reports that she has never smoked. She has never used smokeless tobacco. She reports that she does not drink alcohol or use drugs.  Review of Systems: Constitutional: Negative for fever malaise or anorexia Cardiovascular: negative for chest pain Respiratory: negative for SOB or persistent cough Gastrointestinal: negative for abdominal pain  Objective  Vitals: BP 98/66   Pulse 75   Temp (!) 97.2 F (36.2 C) (Tympanic)   Resp 14   Ht 5' 6.25" (1.683 m)   Wt 122 lb 12.8 oz (55.7 kg)   SpO2 98%   BMI 19.67 kg/m  General: no acute distress , A&Ox3 Skin:  Warm, no rashes, multiple benign appearing moles     Commons side effects, risks, benefits, and alternatives for medications and treatment plan prescribed today were discussed, and the patient expressed understanding of the given instructions. Patient is instructed to call or message via MyChart if he/she has any questions or concerns regarding our treatment plan. No barriers to understanding were identified. We discussed Red Flag symptoms and signs in detail. Patient expressed understanding regarding what to do in case of urgent or emergency type symptoms.   Medication list was reconciled, printed and provided to the patient in AVS. Patient instructions and summary  information was reviewed with the patient as documented in the AVS. This note was prepared with assistance of Dragon voice recognition software. Occasional wrong-word or sound-a-like substitutions may have occurred due to the inherent limitations of voice recognition software

## 2019-08-04 LAB — INSULIN, RANDOM: Insulin: 1.4 u[IU]/mL

## 2019-09-08 ENCOUNTER — Telehealth: Payer: Self-pay | Admitting: Family Medicine

## 2019-09-08 NOTE — Telephone Encounter (Signed)
Called to speak with patient. Pt described she has been having swollen/burning ankles and calfs for the past two weeks, tried scheduling the patient for an in office visit on 11/31/20, but declined due to a job interview. Pt is scheduled for 09/09/2019 in office with Dr. Jonni Sanger

## 2019-09-08 NOTE — Telephone Encounter (Signed)
Patient called to get scheduled for an appointment however, her symptoms are a bit inconsistent and I believe need to be triaged. Brittney and Delsa Sale were unavailable to triage at the time of call.   For the past day or so, the patient has been unable to walk normally, has swollen/burning ankles and calfs, constant urination, thirsty and her knuckles hurt. She mentioned she has ulcerative colitis and thinks that the medicine is beginning to take a toll on her health.   Please contact patient to advise and see if an appointment is suitable. I did not schedule as I was unsure if it was appropriate.

## 2019-09-09 ENCOUNTER — Other Ambulatory Visit: Payer: Self-pay

## 2019-09-09 ENCOUNTER — Ambulatory Visit: Payer: No Typology Code available for payment source | Admitting: Family Medicine

## 2019-09-09 NOTE — Telephone Encounter (Signed)
Noted  

## 2019-09-10 ENCOUNTER — Ambulatory Visit: Payer: No Typology Code available for payment source | Admitting: Family Medicine

## 2019-09-10 ENCOUNTER — Telehealth: Payer: Self-pay | Admitting: Family Medicine

## 2019-09-10 NOTE — Telephone Encounter (Signed)
Please schedule patient for an in office visit.

## 2019-09-10 NOTE — Telephone Encounter (Signed)
Patient calling again, states that now she will not be able to make in person appointment because she never received a call back. Patient requesting to speak with CMA regarding appointment.

## 2019-09-10 NOTE — Telephone Encounter (Signed)
Please see message below and advise . Thanks

## 2019-09-10 NOTE — Telephone Encounter (Signed)
Patient called this morning to see if the appointment was still needed as she was feeling better, she did not get a call back from clinical staff. She can not be a no show as she was feeling better and wanted reassurance if she did or did not need the appointment. I cancelled the appointment. No further action required.

## 2019-09-10 NOTE — Telephone Encounter (Signed)
See note  Copied from [place] [id number]. Topic: General - Other >> Sep 10, 2019  9:25 AM Sheran Luz wrote: Patient calling regarding appointment scheduled today- she states that the swelling she was concerned about has decreased significantly. She inquired if she should keep appointment but would like to speak with [place] or Dr. Tamela Oddi CMA today, before appointment, if possible. Patient also inquired if this visit could maybe be virtual.

## 2019-09-10 NOTE — Telephone Encounter (Signed)
Please call her back at your convenience.  She will be a no show.  Thanks.

## 2019-09-10 NOTE — Telephone Encounter (Signed)
Please advise  Copied from [place] [id number]. Topic: General - Call Back - No Documentation >> Sep 10, 2019  3:47 PM Erick Blinks wrote: Reason for CRM: Pt called requesting a call back to know if she needs to come in for an appt or have a virtual visit. She would prefer a VV. She has pain in her wrists, knuckles and elbows. She wants Dr. Jonni Sanger to make the call of whether she should come in to the office or not. Call back request

## 2019-09-10 NOTE — Telephone Encounter (Signed)
Please advise 

## 2019-09-10 NOTE — Telephone Encounter (Signed)
An in office visit would be best so I can examine her.

## 2019-09-11 MED FILL — SERTRALINE HCL 100 MG TAB: 100 | 90 days supply | Qty: 90 | Fill #0

## 2019-09-11 NOTE — Telephone Encounter (Signed)
Called pt to schedule in office visit, no answer, LVM.

## 2019-09-15 ENCOUNTER — Other Ambulatory Visit: Payer: Self-pay

## 2019-09-15 ENCOUNTER — Encounter: Payer: Self-pay | Admitting: Family Medicine

## 2019-09-15 ENCOUNTER — Ambulatory Visit (INDEPENDENT_AMBULATORY_CARE_PROVIDER_SITE_OTHER): Payer: No Typology Code available for payment source | Admitting: Family Medicine

## 2019-09-15 VITALS — BP 112/62 | HR 88 | Temp 97.6°F | Ht 67.0 in | Wt 122.0 lb

## 2019-09-15 DIAGNOSIS — M7989 Other specified soft tissue disorders: Secondary | ICD-10-CM

## 2019-09-15 DIAGNOSIS — M2550 Pain in unspecified joint: Secondary | ICD-10-CM

## 2019-09-15 NOTE — Progress Notes (Signed)
Subjective  CC:  Chief Complaint  Patient presents with  . Wrist Pain  . Tingling  . Elbow Pain  . Neck Pain    HPI: Lacey Leon is a 58 y.o. female who presents to the office today to address the problems listed above in the chief complaint.  58 yo had bilateral ankle swelling and soreness last week. Since has completely resolved. Did have a salty meal the weekend prior. Also c/o soreness at several joints at times: shoulders, elbows and hands: no redness, swelling or h/o joint pain. Also c/o pain in upper back and traps. Mild. No pain now. No FH of rheum or inflammatory arthropathies. Feels fine today.   Assessment  1. Leg swelling   2. Arthralgia, unspecified joint      Plan   Leg swelling :  Likely from salty foods and fluid retention. Reassured.   Joint pain: advil or tylenol as needed. No worrisome findings or symptoms today.   Follow up: No follow-ups on file.  12/22/2019  No orders of the defined types were placed in this encounter.  No orders of the defined types were placed in this encounter.     I reviewed the patients updated PMH, FH, and SocHx.    Patient Active Problem List   Diagnosis Date Noted  . 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 Familial hyperlipidemia 06/23/2019  . Major depression, recurrent, chronic ([place]) 11/01/2018  . Chronic prescription benzodiazepine use 11/01/2018  . Situational anxiety 12/23/2015  . Generalized anxiety disorder 12/22/2015  . Ulcerative colitis ([place]) 12/22/2015   Current Meds  Medication Sig  . LORazepam (ATIVAN) 1 MG tablet Take 1 tablet (1 mg total) by mouth 2 (two) times daily as needed for anxiety.  . sertraline (ZOLOFT) 100 MG tablet Take 1 tablet by mouth daily.  . [DISCONTINUED] mesalamine (CANASA) 1000 MG suppository Place 1,000 mg rectally at bedtime.    Allergies: Patient is allergic to clarithromycin and other. Family History: Patient family history includes Depression in her mother; Heart disease in her  father. Social History:  Patient  reports that she has never smoked. She has never used smokeless tobacco. She reports that she does not drink alcohol or use drugs.  Review of Systems: Constitutional: Negative for fever malaise or anorexia Cardiovascular: negative for chest pain Respiratory: negative for SOB or persistent cough Gastrointestinal: negative for abdominal pain  Objective  Vitals: BP 112/62 (BP Location: Left Arm, Patient Position: Sitting, Cuff Size: Normal)   Pulse 88   Temp 97.6 F (36.4 C) (Temporal)   Ht 5' 7"  (1.702 m)   Wt 122 lb (55.3 kg)   SpO2 97%   BMI 19.11 kg/m  General: no acute distress , A&Ox3 Joints: all normal appearing w/o redness, swelling or ttp.  Shoulders: FROM Ankles: FROM, no tenderess or swelling     Commons side effects, risks, benefits, and alternatives for medications and treatment plan prescribed today were discussed, and the patient expressed understanding of the given instructions. Patient is instructed to call or message via MyChart if he/she has any questions or concerns regarding our treatment plan. No barriers to understanding were identified. We discussed Red Flag symptoms and signs in detail. Patient expressed understanding regarding what to do in case of urgent or emergency type symptoms.   Medication list was reconciled, printed and provided to the patient in AVS. Patient instructions and summary information was reviewed with the patient as documented in the AVS. This note was prepared with assistance of Dragon voice recognition  software. Occasional wrong-word or sound-a-like substitutions may have occurred due to the inherent limitations of voice recognition software  This visit occurred during the SARS-CoV-2 public health emergency.  Safety protocols were in place, including screening questions prior to the visit, additional usage of staff PPE, and extensive cleaning of exam room while observing appropriate contact time as  indicated for disinfecting solutions.

## 2019-09-15 NOTE — Patient Instructions (Signed)
Please follow up as scheduled for your next visit with me: 12/22/2019   If you have any questions or concerns, please don't hesitate to send me a message via MyChart or call the office at [phone]. Thank you for visiting with [place] today! It's our pleasure caring for you.  Fortunately, everything looks good today! Eat a low salt diet and you can take up to 4000 units of Vit D daily, no more.

## 2019-11-12 ENCOUNTER — Telehealth: Payer: Self-pay | Admitting: Family Medicine

## 2019-11-12 ENCOUNTER — Other Ambulatory Visit: Payer: Self-pay

## 2019-11-12 DIAGNOSIS — M549 Dorsalgia, unspecified: Secondary | ICD-10-CM

## 2019-11-12 DIAGNOSIS — Z1283 Encounter for screening for malignant neoplasm of skin: Secondary | ICD-10-CM

## 2019-11-12 DIAGNOSIS — G8929 Other chronic pain: Secondary | ICD-10-CM

## 2019-11-12 DIAGNOSIS — M2550 Pain in unspecified joint: Secondary | ICD-10-CM

## 2019-11-12 DIAGNOSIS — D229 Melanocytic nevi, unspecified: Secondary | ICD-10-CM

## 2019-11-12 NOTE — Telephone Encounter (Signed)
Pt was returning a call - she was at work and couldn't talk for very long.  She is requesting a call back around 5:15 - I explained that we close at 5 and may not be able to do that. She then requested you call and leave her a detailed voicemail. She said to leave as much detail as possible.

## 2019-11-12 NOTE — Telephone Encounter (Signed)
Please advise 

## 2019-11-12 NOTE — Telephone Encounter (Signed)
I called patient to reschedule her appointment and she stated that she has been having some severe back pain when she is standing to do the dishes, and expressed concern for her moles stating she feels like these needs were not addressed thoroughly and would like a referral for physical therapy and to see a dermatologist.

## 2019-11-12 NOTE — Telephone Encounter (Signed)
She can schedule a visit with any dermatologist for mole check/skin cancer screening. Does not need a referral for this. Can give her a few numbers to call.   Back pain is new. We discussed joint pain at last visit.  Ok to refer to [facility] PT for back pain but would need visit with me if worsens.   Thanks.

## 2019-11-12 NOTE — Telephone Encounter (Signed)
Spoke with patient. See other message.

## 2019-11-12 NOTE — Telephone Encounter (Signed)
LVM for patient to return call. 

## 2019-11-12 NOTE — Telephone Encounter (Signed)
Notified patient. Patient prefers a referral to a derm due to her insurance. Referral to dermatology and [facility] PT placed. Patient notified that someone will call her with an appointment in the next week or two.

## 2019-11-26 MED FILL — LORAZEPAM 1 MG TABS: 1 | 90 days supply | Qty: 90 | Fill #1

## 2019-12-03 MED FILL — SERTRALINE HCL 100 MG TAB: 100 | 90 days supply | Qty: 90 | Fill #1

## 2019-12-07 ENCOUNTER — Emergency Department (HOSPITAL_COMMUNITY): Payer: No Typology Code available for payment source

## 2019-12-07 ENCOUNTER — Other Ambulatory Visit: Payer: Self-pay

## 2019-12-07 ENCOUNTER — Emergency Department (HOSPITAL_COMMUNITY)
Admission: EM | Admit: 2019-12-07 | Discharge: 2019-12-07 | Disposition: A | Discharge status: Home / Self Care | Payer: No Typology Code available for payment source | Attending: Emergency Medicine | Admitting: Emergency Medicine

## 2019-12-07 ENCOUNTER — Encounter (HOSPITAL_COMMUNITY): Payer: Self-pay | Admitting: Emergency Medicine

## 2019-12-07 DIAGNOSIS — Z79899 Other long term (current) drug therapy: Secondary | ICD-10-CM | POA: Insufficient documentation

## 2019-12-07 DIAGNOSIS — Y93E9 Activity, other interior property and clothing maintenance: Secondary | ICD-10-CM | POA: Insufficient documentation

## 2019-12-07 DIAGNOSIS — S62101A Fracture of unspecified carpal bone, right wrist, initial encounter for closed fracture: Secondary | ICD-10-CM

## 2019-12-07 DIAGNOSIS — W010XXA Fall on same level from slipping, tripping and stumbling without subsequent striking against object, initial encounter: Secondary | ICD-10-CM | POA: Diagnosis not present

## 2019-12-07 DIAGNOSIS — Y92009 Unspecified place in unspecified non-institutional (private) residence as the place of occurrence of the external cause: Secondary | ICD-10-CM | POA: Diagnosis not present

## 2019-12-07 DIAGNOSIS — Y999 Unspecified external cause status: Secondary | ICD-10-CM | POA: Diagnosis not present

## 2019-12-07 DIAGNOSIS — S6991XA Unspecified injury of right wrist, hand and finger(s), initial encounter: Secondary | ICD-10-CM | POA: Diagnosis present

## 2019-12-07 DIAGNOSIS — S52611A Displaced fracture of right ulna styloid process, initial encounter for closed fracture: Secondary | ICD-10-CM | POA: Diagnosis not present

## 2019-12-07 DIAGNOSIS — W19XXXA Unspecified fall, initial encounter: Secondary | ICD-10-CM

## 2019-12-07 DIAGNOSIS — F419 Anxiety disorder, unspecified: Secondary | ICD-10-CM | POA: Insufficient documentation

## 2019-12-07 MED ORDER — LORAZEPAM 1 MG PO TABS
1.0000 mg | ORAL_TABLET | Freq: Once | ORAL | Status: AC
  Administered 2019-12-07: 1 mg via ORAL
  Filled 2019-12-07: qty 1

## 2019-12-07 MED ORDER — HYDROCODONE-ACETAMINOPHEN 5-325 MG PO TABS
1.0000 | ORAL_TABLET | Freq: Once | ORAL | Status: AC
  Administered 2019-12-07: 1 via ORAL
  Filled 2019-12-07: qty 1

## 2019-12-07 MED ORDER — NAPROXEN 500 MG PO TABS
500.0000 mg | ORAL_TABLET | Freq: Once | ORAL | Status: AC
  Administered 2019-12-07: 500 mg via ORAL
  Filled 2019-12-07: qty 1

## 2019-12-07 MED ORDER — FENTANYL CITRATE (PF) 100 MCG/2ML IJ SOLN
100.0000 ug | Freq: Once | INTRAMUSCULAR | Status: AC
  Administered 2019-12-07: 100 ug via INTRAVENOUS
  Filled 2019-12-07: qty 2

## 2019-12-07 MED ORDER — HYDROCODONE-ACETAMINOPHEN 5-325 MG PO TABS: 1.0000 | ORAL_TABLET | Freq: Four times a day (QID) | ORAL | 0 refills | Status: DC | PRN

## 2019-12-07 NOTE — TOC Initial Note (Signed)
Transition of Care [facility]) - Initial/Assessment Note    Patient Details  Name: Lacey Leon MRN: [id number] Date of Birth: Dec 20, 1960  Transition of Care [facility]) CM/SW Contact:    Joellen Jersey, [place] Phone Number: 12/07/2019, 9:29 AM  Clinical Narrative:                 59 year old female presents to ED from home after fall while making her bed. She fell onto her right outstretched arm and has pain and deformity of the right wrist. She rates her pain is a 10 out of 10 when moving it but significantly less pain at rest.  She can feel and move her fingers distally. She also has some pain to her lateral right thigh but is able to bear weight on her right leg. She was also placed in a temporary splint.   Patient Goals and CMS Choice  Patient expressed anxiety regarding discharging home, as she lives alone. She is able to ambulate and use her left arm. Patient will follow up with an orthopedic specialist Monday.  Expected Discharge Plan and Services  Returning to her own home. No HH or DME required other than a shoulder sling.  Prior Living Arrangements/Services  From home, lives alone but has neighbors close by. No home safety concerns. Has expressed concerns regarding grocery shopping and driving; CSW and patient discussed using mobile shopping applications and transportation services.  Admission diagnosis:  Fall RT arm  Patient Active Problem List   Diagnosis Date Noted  . 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 Familial hyperlipidemia 06/23/2019  . Major depression, recurrent, chronic ([place]) 11/01/2018  . Chronic prescription benzodiazepine use 11/01/2018  . Situational anxiety 12/23/2015  . Generalized anxiety disorder 12/22/2015  . Ulcerative colitis ([place]) 12/22/2015   PCP:  Leamon Arnt, MD Pharmacy:   [place], [place] - [address] [place] [address] Phone: [phone] Fax: [phone]     Social Determinants  of Health (SDOH) Interventions    Readmission Risk Interventions No flowsheet data found.

## 2019-12-07 NOTE — ED Provider Notes (Addendum)
[facility] DEPT Provider Note: Georgena Spurling, MD, FACEP  CSN: [id number] MRN: [id number] ARRIVAL: 12/07/19 at [place]: [place] [facility]  Wrist Injury   HISTORY OF PRESENT ILLNESS  12/07/19 2:34 AM Lacey Leon is a 59 y.o. female who fell while making her bed about 130 this morning.  She fell onto her right outstretched arm and has pain and deformity of the right wrist.  She rates her pain is a 10 out of 10 when moving it but significantly less pain at rest.  She can feel and move her fingers distally.  She also has some pain to her lateral right thigh but is able to bear weight on her right leg.  She was given 150 mcg of fentanyl IV by EMS prior to arrival with partial relief of her pain.  She was also placed in a temporary splint.   Past Medical History:  Diagnosis Date  . Arrhythmia   . Blood in stool   . Chicken pox   . Chronic prescription benzodiazepine use 11/01/2018  . Familial hyperlipidemia 06/23/2019   Excellent HDL but LDL 197 06/2019; recommended statin  . GERD (gastroesophageal reflux disease)   . Major depression, recurrent, chronic ([place]) 11/01/2018   On zoloft since age 47  . Migraine   . Migraine headache 12/22/2015  . Reaction, adjustment, with depressed mood, prolonged 01/07/2016    Past Surgical History:  Procedure Laterality Date  . BREAST BIOPSY  2010    Family History  Problem Relation Age of Onset  . Depression Mother   . Heart disease Father     Social History   Tobacco Use  . Smoking status: Never Smoker  . Smokeless tobacco: Never Used  Substance Use Topics  . Alcohol use: Never  . Drug use: Never    Prior to Admission medications   Medication Sig Start Date End Date Taking? Authorizing Provider  ergocalciferol (DRISDOL) 200 MCG/ML drops Take 8,000 Units by mouth daily.   Yes [provider]  fluticasone (FLONASE) 50 MCG/ACT nasal spray Place 1 spray into both nostrils daily as needed for allergies or rhinitis.    Yes [provider]  LORazepam (ATIVAN) 1 MG tablet Take 1 tablet (1 mg total) by mouth 2 (two) times daily as needed for anxiety. Patient taking differently: Take 1 mg by mouth at bedtime.  11/01/18  Yes Leamon Arnt, MD  naproxen sodium (ANAPROX) 550 MG tablet Take 550 mg by mouth 2 (two) times daily as needed for headache.   Yes [provider]  sertraline (ZOLOFT) 100 MG tablet Take 100 mg by mouth at bedtime.  06/13/19  Yes [provider]  HYDROcodone-acetaminophen (NORCO) 5-325 MG tablet Take 1 tablet by mouth every 6 (six) hours as needed for severe pain. 12/07/19   Ridhi Hoffert, MD    Allergies Ciprofloxacin, Clarithromycin, and Other   REVIEW OF SYSTEMS  Negative except as noted here or in the History of Present Illness.   PHYSICAL EXAMINATION  Initial Vital Signs Blood pressure 118/77, pulse 95, temperature 98 F (36.7 C), temperature source Oral, resp. rate (!) 22, height 5' 7"  (1.702 m), weight 54.9 kg, SpO2 100 %.  Examination General: Well-developed, well-nourished female in no acute distress; appearance consistent with age of record HENT: normocephalic; atraumatic Eyes: pupils equal, round and reactive to light; extraocular muscles intact Neck: supple Heart: regular rate and rhythm Lungs: clear to auscultation bilaterally Abdomen: soft; nondistended; nontender; bowel sounds present Extremities: Soft tissue tenderness of  lateral right thigh; deformity of right wrist with decreased range of motion, right hand distally neurovascularly intact:    Neurologic: Awake, alert and oriented; motor function intact in all extremities and symmetric; no facial droop Skin: Warm and dry Psychiatric: Anxious   RESULTS  Summary of this visit's results, reviewed and interpreted by myself:   EKG Interpretation  Date/Time:    Ventricular Rate:    PR Interval:    QRS Duration:   QT Interval:    QTC Calculation:   R Axis:     Text Interpretation:          Laboratory Studies: No results found for this or any previous visit (from the past 24 hour(s)). Imaging Studies: DG Forearm Right  Result Date: 12/07/2019 CLINICAL DATA:  Pain and fall EXAM: RIGHT FOREARM - 2 VIEW COMPARISON:  None. FINDINGS: Comminuted mildly displaced impacted distal radius fracture seen. Mildly displaced ulnar styloid fracture is noted. IMPRESSION: Comminuted impacted mildly displaced distal radius and ulnar styloid fractures. Electronically Signed   By: Prudencio Pair M.D.   On: 12/07/2019 03:00   DG Wrist Complete Right  Result Date: 12/07/2019 CLINICAL DATA:  Fall and pain EXAM: RIGHT WRIST - COMPLETE 3+ VIEW COMPARISON:  None. FINDINGS: There is a comminuted impacted mildly displaced distal radius fracture with apex dorsal angulation. Mildly displaced ulnar styloid fracture seen. There is widening of the radioulnar joint. Overlying soft tissue swelling is seen. IMPRESSION: Comminuted impacted mildly displaced distal radius fracture. Mildly displaced ulnar styloid fracture. Electronically Signed   By: Prudencio Pair M.D.   On: 12/07/2019 02:59    ED COURSE and MDM  Nursing notes, initial and subsequent vitals signs, including pulse oximetry, reviewed and interpreted by myself.  Vitals:   12/07/19 0228 12/07/19 0231 12/07/19 0357 12/07/19 0625  BP: 118/77  104/65 111/70  Pulse: 95  91 90  Resp: (!) 22  20 16   Temp: 98 F (36.7 C)     TempSrc: Oral     SpO2: 100%  97% 97%  Weight:  54.9 kg    Height:  5' 7"  (1.702 m)     Medications  LORazepam (ATIVAN) tablet 1 mg (has no administration in time range)  fentaNYL (SUBLIMAZE) injection 100 mcg (100 mcg Intravenous Given 12/07/19 0323)  HYDROcodone-acetaminophen (NORCO/VICODIN) 5-325 MG per tablet 1 tablet (1 tablet Oral Given 12/07/19 0420)  naproxen (NAPROSYN) tablet 500 mg (500 mg Oral Given 12/07/19 0603)   Wrist placed in forearm volar splint.  Right upper extremity remains neurovascularly intact distally.   Will refer to Dr. Stann Mainland of [facility] for definitive surgical treatment.   PROCEDURES  .Ortho Injury Treatment  Date/Time: 12/07/2019 4:06 AM Performed by: Claxton Levitz, MD Authorized by: Treniyah Lynn, MD   Consent:    Consent obtained:  Verbal   Consent given by:  Patient   Risks discussed:  FractureInjury location: wrist Location details: right wrist Injury type: fracture Fracture type: distal radius and ulnar styloid Pre-procedure neurovascular assessment: neurovascularly intact Pre-procedure distal perfusion: normal Pre-procedure neurological function: normal Pre-procedure range of motion: reduced  Anesthesia: Local anesthesia used: no  Patient sedated: NoManipulation performed: no Immobilization: splint Splint type: volar short arm Supplies used: Ortho-Glass,  cotton padding and elastic bandage Post-procedure neurovascular assessment: post-procedure neurovascularly intact Post-procedure distal perfusion: normal Post-procedure neurological function: normal Post-procedure range of motion: unchanged Patient tolerance: patient tolerated the procedure well with no immediate complications    ED DIAGNOSES     ICD-10-CM   1. Fall at home,  initial encounter  W19.XXXA    Y92.009   2. Closed fracture of right wrist, initial encounter  S62.101A   3. Anxiety  F41.9        Sandeep Delagarza, MD 12/07/19 0411    Shanon Rosser, MD 12/07/19 [id number]

## 2019-12-07 NOTE — ED Notes (Signed)
Patient was previously discharged and had a reported panic attack so was un-discharged and given medication to help. Patient is still up for discharge and reportedly waiting for a ride at this time. Patient was reportedly able to walk to the bathroom without incident.

## 2019-12-07 NOTE — ED Triage Notes (Signed)
Pt arrived via EMS from home. Pt fell around 1:30am on her right arm and right hip. Pt was able to walk. Pt has deformities to her right wrist and forearm.

## 2019-12-07 NOTE — ED Notes (Signed)
RN again reviewed discharge instructions and attempted to address patient's fears regarding discharge. RN placed Transition of care consult as patient reports she has no help at home and is scared about getting in with the orthopedic doctor.  Patient assisted to the bathroom by this RN without difficulty.

## 2019-12-07 NOTE — ED Notes (Signed)
Social worker at bedside.

## 2019-12-08 ENCOUNTER — Other Ambulatory Visit (HOSPITAL_COMMUNITY)
Admission: RE | Admit: 2019-12-08 | Discharge: 2019-12-08 | Disposition: A | Discharge status: Home / Self Care | Payer: No Typology Code available for payment source | Source: Ambulatory Visit | Attending: Orthopaedic Surgery | Admitting: Orthopaedic Surgery

## 2019-12-08 ENCOUNTER — Ambulatory Visit (INDEPENDENT_AMBULATORY_CARE_PROVIDER_SITE_OTHER): Payer: No Typology Code available for payment source | Admitting: Orthopaedic Surgery

## 2019-12-08 ENCOUNTER — Encounter: Payer: Self-pay | Admitting: Orthopaedic Surgery

## 2019-12-08 DIAGNOSIS — Z20822 Contact with and (suspected) exposure to covid-19: Secondary | ICD-10-CM | POA: Insufficient documentation

## 2019-12-08 DIAGNOSIS — Z01812 Encounter for preprocedural laboratory examination: Secondary | ICD-10-CM | POA: Insufficient documentation

## 2019-12-08 DIAGNOSIS — S52501A Unspecified fracture of the lower end of right radius, initial encounter for closed fracture: Secondary | ICD-10-CM

## 2019-12-08 DIAGNOSIS — S52601A Unspecified fracture of lower end of right ulna, initial encounter for closed fracture: Secondary | ICD-10-CM

## 2019-12-08 LAB — SARS CORONAVIRUS 2 (TAT 6-24 HRS): SARS Coronavirus 2: NEGATIVE

## 2019-12-08 MED FILL — HYDROCODON-APAP 5-325: 5-325 | 5 days supply | Qty: 20 | Fill #0

## 2019-12-08 NOTE — Progress Notes (Signed)
Office Visit Note   Patient: Lacey Leon           Date of Birth: 06-Nov-1960           MRN: [id number] Visit Date: 12/08/2019              Requested by: Leamon Arnt, [place],   [id number] PCP: Leamon Arnt, MD   Assessment & Plan: Visit Diagnoses:  1. Closed fracture of distal ends of right radius and ulna, initial encounter     Plan: Patient has angulation shortening and displacement distal radius fracture with significant dorsal comminution.  Ulnar styloid fracture displaced.  Widening of the distal radial ulnar joint.  Patient will require open reduction internal fixation of the distal radius.  If ulnar styloid is in a reasonable position no surgical treatment would be required for the ulna.  We discussed surgery in a couple days.  Procedure was discussed we discussed likely preoperative block.  She lives alone would like to stay overnight in the hospital she has a neighbor can look in on her the following day.  Questions elicited and answered she understands request to proceed.  Follow-Up Instructions: Return in about 9 days (around 12/17/2019).   Orders:  No orders of the defined types were placed in this encounter.  No orders of the defined types were placed in this encounter.     Procedures: No procedures performed   Clinical Data: No additional findings.   Subjective: Chief Complaint  Patient presents with  . Right Wrist - Fracture    Fall 12/07/2019    HPI 59 year old female unemployed was at home working now making bad at 1:30 AM when she tripped falling on outstretched right arm.  She is right-hand dominant and suffered a distal radius comminuted fracture with ulnar styloid fracture.  She underwent splinting in the emergency room and x-rays demonstrated a comminuted displaced distal radius fracture with ulnar styloid fracture.  Patient has past history of anxiety she takes Ativan 1 mg at night and also depression for which she  takes Zoloft daily.  Patient works for [employer] and lives by herself.    Review of Systems past history of GERD.  Positive for depression on Zoloft x20 years stable.  History of migraines.  Previous surgeries include oophorectomy breast biopsy no problems with the procedures.  Otherwise negative as it pertains HPI.   Objective: Vital Signs: BP 100/68   Pulse 91   Ht 5' 7.25" (1.708 m)   Wt 120 lb (54.4 kg)   BMI 18.66 kg/m   Physical Exam Constitutional:      Appearance: She is well-developed.  HENT:     Head: Normocephalic.     Right Ear: External ear normal.     Left Ear: External ear normal.  Eyes:     Pupils: Pupils are equal, round, and reactive to light.  Neck:     Thyroid: No thyromegaly.     Trachea: No tracheal deviation.  Cardiovascular:     Rate and Rhythm: Normal rate and regular rhythm.  Pulmonary:     Effort: Pulmonary effort is normal.     Breath sounds: Normal breath sounds. No wheezing or rales.  Abdominal:     Palpations: Abdomen is soft.  Skin:    General: Skin is warm and dry.  Neurological:     Mental Status: She is alert and oriented to person, place, and time.  Psychiatric:  Behavior: Behavior normal.     Ortho Exam Patient has mild swelling with fingertips right hand.  Median sensation is intact.  Patient says  Specialty Comments:  No specialty comments available.  Imaging: No results found.   PMFS History: Patient Active Problem List   Diagnosis Date Noted  . Closed fracture of right distal radius and ulna 12/08/2019  . 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 Familial hyperlipidemia 06/23/2019  . Major depression, recurrent, chronic ([place]) 11/01/2018  . Chronic prescription benzodiazepine use 11/01/2018  . Situational anxiety 12/23/2015  . Generalized anxiety disorder 12/22/2015  . Ulcerative colitis ([place]) 12/22/2015   Past Medical History:  Diagnosis Date  . Arrhythmia   . Blood in stool   .  Chicken pox   . Chronic prescription benzodiazepine use 11/01/2018  . Familial hyperlipidemia 06/23/2019   Excellent HDL but LDL 197 06/2019; recommended statin  . GERD (gastroesophageal reflux disease)   . Major depression, recurrent, chronic ([place]) 11/01/2018   On zoloft since age 57  . Migraine   . Migraine headache 12/22/2015  . Reaction, adjustment, with depressed mood, prolonged 01/07/2016    Family History  Problem Relation Age of Onset  . Depression Mother   . Heart disease Father     Past Surgical History:  Procedure Laterality Date  . BREAST BIOPSY  2010   Social History   Occupational History  . Not on file  Tobacco Use  . Smoking status: Never Smoker  . Smokeless tobacco: Never Used  Substance and Sexual Activity  . Alcohol use: Never  . Drug use: Never  . Sexual activity: Never

## 2019-12-09 ENCOUNTER — Telehealth: Payer: Self-pay

## 2019-12-09 ENCOUNTER — Other Ambulatory Visit: Payer: Self-pay

## 2019-12-09 ENCOUNTER — Encounter (HOSPITAL_COMMUNITY): Payer: Self-pay | Admitting: Orthopaedic Surgery

## 2019-12-09 NOTE — Telephone Encounter (Signed)
Rep from [facility] called in stating that patient is needing a referral placed with Dr. Rodell Perna for ortho surgery that she is having tomorrow. Please advise if OK to place referral.

## 2019-12-09 NOTE — Progress Notes (Signed)
Spoke with pt for pre-op call. Pt states she has hx of Mitral Valve Prolapse, Echo done on 07/08/19, showed mild Mitral Valve prolapse. Pt states she thinks she can be tachycardic at times. EKG ordered for day of surgery. Pt denies hx of HTN or Diabetes.   Pt states she cannot take oral antibiotics due to hx of ulcerative colitis. She requests that she not be given any injections or medications that she is not aware that she is being given.   Covid test was done 12/08/19 and it was negative. Pt states she has been in quarantine since the test was done and understands she needs to stay in quarantine until she comes to the hospital.  Pt instructed to drink clear liquids until 2:00 PM (3 hours prior to surgery). Pt unable to come to hospital today to pick up a Pre-surgery Ensure drink. Just encouraged pt to drink clear liquids until 2:00 PM.

## 2019-12-10 ENCOUNTER — Observation Stay (HOSPITAL_COMMUNITY)
Admission: RE | Admit: 2019-12-10 | Discharge: 2019-12-12 | Disposition: A | Discharge status: Home / Self Care | Payer: No Typology Code available for payment source | Attending: Orthopaedic Surgery | Admitting: Orthopaedic Surgery

## 2019-12-10 ENCOUNTER — Encounter (HOSPITAL_COMMUNITY): Payer: Self-pay | Admitting: Orthopaedic Surgery

## 2019-12-10 ENCOUNTER — Other Ambulatory Visit: Payer: Self-pay

## 2019-12-10 ENCOUNTER — Encounter (HOSPITAL_COMMUNITY)
Admission: RE | Disposition: A | Discharge status: Home / Self Care | Payer: Self-pay | Source: Home / Self Care | Attending: Orthopaedic Surgery

## 2019-12-10 ENCOUNTER — Ambulatory Visit (HOSPITAL_COMMUNITY): Payer: No Typology Code available for payment source | Admitting: Anesthesiology

## 2019-12-10 DIAGNOSIS — R2689 Other abnormalities of gait and mobility: Secondary | ICD-10-CM | POA: Diagnosis not present

## 2019-12-10 DIAGNOSIS — K219 Gastro-esophageal reflux disease without esophagitis: Secondary | ICD-10-CM | POA: Insufficient documentation

## 2019-12-10 DIAGNOSIS — Z881 Allergy status to other antibiotic agents status: Secondary | ICD-10-CM | POA: Diagnosis not present

## 2019-12-10 DIAGNOSIS — W010XXD Fall on same level from slipping, tripping and stumbling without subsequent striking against object, subsequent encounter: Secondary | ICD-10-CM | POA: Insufficient documentation

## 2019-12-10 DIAGNOSIS — E7849 Other hyperlipidemia: Secondary | ICD-10-CM | POA: Insufficient documentation

## 2019-12-10 DIAGNOSIS — M1990 Unspecified osteoarthritis, unspecified site: Secondary | ICD-10-CM | POA: Insufficient documentation

## 2019-12-10 DIAGNOSIS — Z8249 Family history of ischemic heart disease and other diseases of the circulatory system: Secondary | ICD-10-CM | POA: Insufficient documentation

## 2019-12-10 DIAGNOSIS — I341 Nonrheumatic mitral (valve) prolapse: Secondary | ICD-10-CM | POA: Insufficient documentation

## 2019-12-10 DIAGNOSIS — R2681 Unsteadiness on feet: Secondary | ICD-10-CM | POA: Diagnosis not present

## 2019-12-10 DIAGNOSIS — F411 Generalized anxiety disorder: Secondary | ICD-10-CM | POA: Insufficient documentation

## 2019-12-10 DIAGNOSIS — S52611A Displaced fracture of right ulna styloid process, initial encounter for closed fracture: Secondary | ICD-10-CM | POA: Insufficient documentation

## 2019-12-10 DIAGNOSIS — F339 Major depressive disorder, recurrent, unspecified: Secondary | ICD-10-CM | POA: Diagnosis not present

## 2019-12-10 DIAGNOSIS — S52501A Unspecified fracture of the lower end of right radius, initial encounter for closed fracture: Secondary | ICD-10-CM | POA: Diagnosis present

## 2019-12-10 DIAGNOSIS — Z818 Family history of other mental and behavioral disorders: Secondary | ICD-10-CM | POA: Diagnosis not present

## 2019-12-10 DIAGNOSIS — S52591A Other fractures of lower end of right radius, initial encounter for closed fracture: Principal | ICD-10-CM | POA: Insufficient documentation

## 2019-12-10 HISTORY — DX: Nonrheumatic mitral (valve) prolapse: I34.1

## 2019-12-10 HISTORY — PX: OPEN REDUCTION INTERNAL FIXATION (ORIF) DISTAL RADIAL FRACTURE: SHX5989

## 2019-12-10 HISTORY — DX: Anxiety disorder, unspecified: F41.9

## 2019-12-10 HISTORY — DX: Ulcerative colitis, unspecified, without complications: K51.90

## 2019-12-10 HISTORY — DX: Other complications of anesthesia, initial encounter: T88.59XA

## 2019-12-10 HISTORY — DX: Unspecified osteoarthritis, unspecified site: M19.90

## 2019-12-10 LAB — COMPREHENSIVE METABOLIC PANEL
ALT: 20 U/L (ref 0–44)
AST: 23 U/L (ref 15–41)
Albumin: 4.3 g/dL (ref 3.5–5.0)
Alkaline Phosphatase: 49 U/L (ref 38–126)
Anion gap: 10 (ref 5–15)
BUN: 14 mg/dL (ref 6–20)
CO2: 24 mmol/L (ref 22–32)
Calcium: 9.2 mg/dL (ref 8.9–10.3)
Chloride: 102 mmol/L (ref 98–111)
Creatinine, Ser: 0.69 mg/dL (ref 0.44–1.00)
GFR calc Af Amer: >60 mL/min (ref >60)
GFR calc non Af Amer: >60 mL/min (ref >60)
Glucose, Bld: 96 mg/dL (ref 70–99)
Potassium: 4 mmol/L (ref 3.5–5.1)
Sodium: 136 mmol/L (ref 135–145)
Total Bilirubin: 1 mg/dL (ref 0.3–1.2)
Total Protein: 6.9 g/dL (ref 6.5–8.1)

## 2019-12-10 LAB — CBC
HCT: 38.9 % (ref 36.0–46.0)
Hemoglobin: 12.4 g/dL (ref 12.0–15.0)
MCH: 25.5 pg — ABNORMAL LOW (ref 26.0–34.0)
MCHC: 31.9 g/dL (ref 30.0–36.0)
MCV: 79.9 fL — ABNORMAL LOW (ref 80.0–100.0)
Platelets: 168 10*3/uL (ref 150–400)
RBC: 4.87 MIL/uL (ref 3.87–5.11)
RDW: 13.1 % (ref 11.5–15.5)
WBC: 4.3 10*3/uL (ref 4.0–10.5)
nRBC: 0 % (ref 0.0–0.2)

## 2019-12-10 LAB — SURGICAL PCR SCREEN
MRSA, PCR: NEGATIVE
Staphylococcus aureus: NEGATIVE

## 2019-12-10 SURGERY — OPEN REDUCTION INTERNAL FIXATION (ORIF) DISTAL RADIUS FRACTURE
Anesthesia: Monitor Anesthesia Care | Laterality: Right

## 2019-12-10 MED ORDER — ONDANSETRON HCL 4 MG/2ML IJ SOLN: 4.0000 mg | Freq: Four times a day (QID) | INTRAMUSCULAR | Status: DC | PRN

## 2019-12-10 MED ORDER — LIDOCAINE-EPINEPHRINE (PF) 1.5 %-1:200000 IJ SOLN
INTRAMUSCULAR | Status: DC | PRN
  Administered 2019-12-10: 10 mL via PERINEURAL

## 2019-12-10 MED ORDER — MIDAZOLAM HCL 2 MG/2ML IJ SOLN
INTRAMUSCULAR | Status: AC
  Filled 2019-12-10: qty 2

## 2019-12-10 MED ORDER — CHLORHEXIDINE GLUCONATE 4 % EX LIQD: 60.0000 mL | Freq: Once | CUTANEOUS | Status: DC

## 2019-12-10 MED ORDER — ACETAMINOPHEN 325 MG PO TABS
325.0000 mg | ORAL_TABLET | Freq: Four times a day (QID) | ORAL | Status: DC | PRN
  Administered 2019-12-11: 650 mg via ORAL
  Filled 2019-12-10: qty 2

## 2019-12-10 MED ORDER — MIDAZOLAM HCL 5 MG/5ML IJ SOLN
INTRAMUSCULAR | Status: DC | PRN
  Administered 2019-12-10 (×2): 1 mg via INTRAVENOUS

## 2019-12-10 MED ORDER — FENTANYL CITRATE (PF) 100 MCG/2ML IJ SOLN: 50.0000 ug | Freq: Once | INTRAMUSCULAR | Status: AC

## 2019-12-10 MED ORDER — METHOCARBAMOL 500 MG PO TABS: 500.0000 mg | ORAL_TABLET | Freq: Four times a day (QID) | ORAL | Status: DC | PRN

## 2019-12-10 MED ORDER — MIDAZOLAM HCL 2 MG/2ML IJ SOLN
INTRAMUSCULAR | Status: AC
  Administered 2019-12-10: 2 mg via INTRAVENOUS
  Filled 2019-12-10: qty 2

## 2019-12-10 MED ORDER — MORPHINE SULFATE (PF) 2 MG/ML IV SOLN: 0.5000 mg | INTRAVENOUS | Status: DC | PRN

## 2019-12-10 MED ORDER — CEFAZOLIN SODIUM-DEXTROSE 2-4 GM/100ML-% IV SOLN
INTRAVENOUS | Status: AC
  Filled 2019-12-10: qty 100

## 2019-12-10 MED ORDER — HYDROMORPHONE HCL 1 MG/ML IJ SOLN: 0.2500 mg | INTRAMUSCULAR | Status: DC | PRN

## 2019-12-10 MED ORDER — SODIUM CHLORIDE 0.45 % IV SOLN: INTRAVENOUS | Status: DC

## 2019-12-10 MED ORDER — BUPIVACAINE HCL (PF) 0.5 % IJ SOLN
INTRAMUSCULAR | Status: DC | PRN
  Administered 2019-12-10: 20 mL via PERINEURAL

## 2019-12-10 MED ORDER — METOCLOPRAMIDE HCL 5 MG PO TABS: 5.0000 mg | ORAL_TABLET | Freq: Three times a day (TID) | ORAL | Status: DC | PRN

## 2019-12-10 MED ORDER — METHOCARBAMOL 1000 MG/10ML IJ SOLN: 500.0000 mg | Freq: Four times a day (QID) | INTRAVENOUS | Status: DC | PRN

## 2019-12-10 MED ORDER — EPHEDRINE SULFATE 50 MG/ML IJ SOLN
INTRAMUSCULAR | Status: DC | PRN
  Administered 2019-12-10: 10 mg via INTRAVENOUS

## 2019-12-10 MED ORDER — DOCUSATE SODIUM 100 MG PO CAPS
100.0000 mg | ORAL_CAPSULE | Freq: Two times a day (BID) | ORAL | Status: DC
  Filled 2019-12-10: qty 1

## 2019-12-10 MED ORDER — PROPOFOL 10 MG/ML IV BOLUS
INTRAVENOUS | Status: AC
  Filled 2019-12-10: qty 20

## 2019-12-10 MED ORDER — LACTATED RINGERS IV SOLN: INTRAVENOUS | Status: DC | PRN

## 2019-12-10 MED ORDER — FENTANYL CITRATE (PF) 250 MCG/5ML IJ SOLN
INTRAMUSCULAR | Status: AC
  Filled 2019-12-10: qty 5

## 2019-12-10 MED ORDER — FENTANYL CITRATE (PF) 100 MCG/2ML IJ SOLN
INTRAMUSCULAR | Status: AC
  Administered 2019-12-10: 50 ug via INTRAVENOUS
  Filled 2019-12-10: qty 2

## 2019-12-10 MED ORDER — ONDANSETRON HCL 4 MG/2ML IJ SOLN
INTRAMUSCULAR | Status: DC | PRN
  Administered 2019-12-10: 4 mg via INTRAVENOUS

## 2019-12-10 MED ORDER — PHENYLEPHRINE HCL (PRESSORS) 10 MG/ML IV SOLN
INTRAVENOUS | Status: DC | PRN
  Administered 2019-12-10 (×2): 40 ug via INTRAVENOUS
  Administered 2019-12-10 (×2): 80 ug via INTRAVENOUS
  Administered 2019-12-10: 40 ug via INTRAVENOUS

## 2019-12-10 MED ORDER — HYDROCODONE-ACETAMINOPHEN 5-325 MG PO TABS: 1.0000 | ORAL_TABLET | ORAL | Status: DC | PRN

## 2019-12-10 MED ORDER — 0.9 % SODIUM CHLORIDE (POUR BTL) OPTIME
TOPICAL | Status: DC | PRN
  Administered 2019-12-10: 1000 mL

## 2019-12-10 MED ORDER — ACETAMINOPHEN 500 MG PO TABS
500.0000 mg | ORAL_TABLET | Freq: Four times a day (QID) | ORAL | Status: AC
  Administered 2019-12-10 – 2019-12-11 (×4): 500 mg via ORAL
  Filled 2019-12-10 (×4): qty 1

## 2019-12-10 MED ORDER — LORAZEPAM 1 MG PO TABS
1.0000 mg | ORAL_TABLET | Freq: Every day | ORAL | Status: DC
  Administered 2019-12-10 – 2019-12-11 (×2): 1 mg via ORAL
  Filled 2019-12-10 (×2): qty 1

## 2019-12-10 MED ORDER — SERTRALINE HCL 100 MG PO TABS
100.0000 mg | ORAL_TABLET | Freq: Every day | ORAL | Status: DC
  Administered 2019-12-10 – 2019-12-11 (×2): 100 mg via ORAL
  Filled 2019-12-10 (×2): qty 1

## 2019-12-10 MED ORDER — FENTANYL CITRATE (PF) 100 MCG/2ML IJ SOLN
INTRAMUSCULAR | Status: DC | PRN
  Administered 2019-12-10: 25 ug via INTRAVENOUS

## 2019-12-10 MED ORDER — METOCLOPRAMIDE HCL 5 MG/ML IJ SOLN: 5.0000 mg | Freq: Three times a day (TID) | INTRAMUSCULAR | Status: DC | PRN

## 2019-12-10 MED ORDER — ONDANSETRON HCL 4 MG PO TABS: 4.0000 mg | ORAL_TABLET | Freq: Four times a day (QID) | ORAL | Status: DC | PRN

## 2019-12-10 MED ORDER — CLONIDINE HCL (ANALGESIA) 100 MCG/ML EP SOLN
EPIDURAL | Status: DC | PRN
  Administered 2019-12-10: 100 ug

## 2019-12-10 MED ORDER — ACETAMINOPHEN 500 MG PO TABS
1000.0000 mg | ORAL_TABLET | Freq: Once | ORAL | Status: AC
  Administered 2019-12-10: 1000 mg via ORAL

## 2019-12-10 MED ORDER — CEFAZOLIN SODIUM-DEXTROSE 2-4 GM/100ML-% IV SOLN
2.0000 g | INTRAVENOUS | Status: AC
  Administered 2019-12-10: 2 g via INTRAVENOUS

## 2019-12-10 MED ORDER — PROPOFOL 500 MG/50ML IV EMUL
INTRAVENOUS | Status: DC | PRN
  Administered 2019-12-10: 50 ug/kg/min via INTRAVENOUS

## 2019-12-10 MED ORDER — HYDROCODONE-ACETAMINOPHEN 7.5-325 MG PO TABS
1.0000 | ORAL_TABLET | ORAL | Status: DC | PRN
  Administered 2019-12-11: 2 via ORAL
  Filled 2019-12-10: qty 1
  Filled 2019-12-10: qty 2

## 2019-12-10 MED ORDER — ACETAMINOPHEN 500 MG PO TABS
ORAL_TABLET | ORAL | Status: AC
  Filled 2019-12-10: qty 2

## 2019-12-10 MED ORDER — TRAMADOL HCL 50 MG PO TABS
50.0000 mg | ORAL_TABLET | Freq: Four times a day (QID) | ORAL | Status: DC
  Administered 2019-12-10 – 2019-12-11 (×3): 50 mg via ORAL
  Filled 2019-12-10 (×3): qty 1

## 2019-12-10 MED ORDER — MIDAZOLAM HCL 2 MG/2ML IJ SOLN: 2.0000 mg | Freq: Once | INTRAMUSCULAR | Status: AC

## 2019-12-10 SURGICAL SUPPLY — 63 items
BIT DRILL 2.2 SS TIBIAL (BIT) ×1 IMPLANT
BNDG CMPR 9X4 STRL LF SNTH (GAUZE/BANDAGES/DRESSINGS) ×1
BNDG ELASTIC 4X5.8 VLCR STR LF (GAUZE/BANDAGES/DRESSINGS) ×1 IMPLANT
BNDG ESMARK 4X9 LF (GAUZE/BANDAGES/DRESSINGS) ×1 IMPLANT
CLSR STERI-STRIP ANTIMIC 1/2X4 (GAUZE/BANDAGES/DRESSINGS) ×1 IMPLANT
CORD BIPOLAR FORCEPS 12FT (ELECTRODE) ×2 IMPLANT
COVER SURGICAL LIGHT HANDLE (MISCELLANEOUS) ×2 IMPLANT
COVER WAND RF STERILE (DRAPES) ×2 IMPLANT
DRAPE OEC MINIVIEW 54X84 (DRAPES) IMPLANT
DRSG PAD ABDOMINAL 8X10 ST (GAUZE/BANDAGES/DRESSINGS) IMPLANT
DURAPREP 26ML APPLICATOR (WOUND CARE) ×2 IMPLANT
ELECT REM PT RETURN 9FT ADLT (ELECTROSURGICAL) ×2
ELECTRODE REM PT RTRN 9FT ADLT (ELECTROSURGICAL) ×1 IMPLANT
GAUZE SPONGE 4X4 12PLY STRL (GAUZE/BANDAGES/DRESSINGS) ×1 IMPLANT
GAUZE XEROFORM 1X8 LF (GAUZE/BANDAGES/DRESSINGS) IMPLANT
GLOVE BIOGEL PI IND STRL 8 (GLOVE) ×2 IMPLANT
GLOVE BIOGEL PI INDICATOR 8 (GLOVE) ×2
GLOVE ORTHO TXT STRL SZ7.5 (GLOVE) ×4 IMPLANT
GOWN STRL REUS W/ TWL LRG LVL3 (GOWN DISPOSABLE) ×1 IMPLANT
GOWN STRL REUS W/ TWL XL LVL3 (GOWN DISPOSABLE) ×1 IMPLANT
GOWN STRL REUS W/TWL 2XL LVL3 (GOWN DISPOSABLE) ×2 IMPLANT
GOWN STRL REUS W/TWL LRG LVL3 (GOWN DISPOSABLE) ×2
GOWN STRL REUS W/TWL XL LVL3 (GOWN DISPOSABLE) ×2
K-WIRE 1.4X100 (WIRE)
K-WIRE 1.6 (WIRE) ×4
K-WIRE FX5X1.6XNS BN SS (WIRE) ×2
KIT BASIN OR (CUSTOM PROCEDURE TRAY) ×2 IMPLANT
KIT TURNOVER KIT B (KITS) ×2 IMPLANT
KWIRE 1.4X100 (WIRE) IMPLANT
KWIRE FX5X1.6XNS BN SS (WIRE) IMPLANT
MANIFOLD NEPTUNE II (INSTRUMENTS) ×2 IMPLANT
NEEDLE 22X1 1/2 (OR ONLY) (NEEDLE) IMPLANT
NS IRRIG 1000ML POUR BTL (IV SOLUTION) ×2 IMPLANT
PACK ORTHO EXTREMITY (CUSTOM PROCEDURE TRAY) ×2 IMPLANT
PAD ARMBOARD 7.5X6 YLW CONV (MISCELLANEOUS) ×4 IMPLANT
PAD CAST 4YDX4 CTTN HI CHSV (CAST SUPPLIES) IMPLANT
PADDING CAST COTTON 4X4 STRL (CAST SUPPLIES) ×2
PEG LOCKING SMOOTH 2.2X18 (Peg) ×6 IMPLANT
PEG LOCKING SMOOTH 2.2X20 (Screw) ×2 IMPLANT
PLATE DVR CROSSLOCK STD RT (Plate) ×1 IMPLANT
SCREW LOCK 14X2.7X 3 LD TPR (Screw) IMPLANT
SCREW LOCKING 2.7X14 (Screw) ×4 IMPLANT
SCREW MULTI DIRECTIONAL 2.7X16 (Screw) ×2 IMPLANT
SPLINT FIBERGLASS 3X35 (CAST SUPPLIES) ×1 IMPLANT
SPLINT PLASTER EXTRA FAST 3X15 (CAST SUPPLIES) ×1
SPLINT PLASTER GYPS XFAST 3X15 (CAST SUPPLIES) IMPLANT
SPONGE LAP 4X18 RFD (DISPOSABLE) ×4 IMPLANT
STAPLER VISISTAT 35W (STAPLE) ×2 IMPLANT
STRIP CLOSURE SKIN 1/2X4 (GAUZE/BANDAGES/DRESSINGS) ×2 IMPLANT
SUCTION FRAZIER HANDLE 10FR (MISCELLANEOUS) ×2
SUCTION TUBE FRAZIER 10FR DISP (MISCELLANEOUS) ×1 IMPLANT
SUT PROLENE 3 0 PS 1 (SUTURE) ×2 IMPLANT
SUT VIC AB 2-0 CT1 27 (SUTURE) ×2
SUT VIC AB 2-0 CT1 TAPERPNT 27 (SUTURE) IMPLANT
SUT VIC AB 3-0 X1 27 (SUTURE) ×2 IMPLANT
SUT VICRYL 4-0 PS2 18IN ABS (SUTURE) IMPLANT
SYR CONTROL 10ML LL (SYRINGE) IMPLANT
TOWEL GREEN STERILE (TOWEL DISPOSABLE) ×2 IMPLANT
TOWEL GREEN STERILE FF (TOWEL DISPOSABLE) ×2 IMPLANT
TUBE CONNECTING 12X1/4 (SUCTIONS) ×2 IMPLANT
UNDERPAD 30X30 (UNDERPADS AND DIAPERS) ×2 IMPLANT
WATER STERILE IRR 1000ML POUR (IV SOLUTION) ×2 IMPLANT
YANKAUER SUCT BULB TIP NO VENT (SUCTIONS) ×2 IMPLANT

## 2019-12-10 NOTE — H&P (Signed)
Office Visit Note  Patient: Lacey Leon  Date of Birth: [birth year]  MRN: [id number]  Visit Date: 12/08/2019  Requested by: Leamon Arnt, [address], [place] [id number]  PCP: Leamon Arnt, MD  Assessment & Plan:  Visit Diagnoses:  1. Closed fracture of distal ends of right radius and ulna, initial encounter    Plan: Patient has angulation shortening and displacement distal radius fracture with significant dorsal comminution. Ulnar styloid fracture displaced. Widening of the distal radial ulnar joint. Patient will require open reduction internal fixation of the distal radius. If ulnar styloid is in a reasonable position no surgical treatment would be required for the ulna. We discussed surgery in a couple days. Procedure was discussed we discussed likely preoperative block. She lives alone would like to stay overnight in the hospital she has a neighbor can look in on her the following day. Questions elicited and answered she understands request to proceed.  Follow-Up Instructions: Return in about 9 days (around 12/17/2019).  Orders:  No orders of the defined types were placed in this encounter.   No orders of the defined types were placed in this encounter.   Procedures:  No procedures performed  Clinical Data:  No additional findings.  Subjective:      Chief Complaint  Patient presents with  . Right Wrist - Fracture    Fall 12/07/2019   HPI 59 year old female unemployed was at home working now making bad at 1:30 AM when she tripped falling on outstretched right arm. She is right-hand dominant and suffered a distal radius comminuted fracture with ulnar styloid fracture. She underwent splinting in the emergency room and x-rays demonstrated a comminuted displaced distal radius fracture with ulnar styloid fracture. Patient has past history of anxiety she takes Ativan 1 mg at night and also depression for which she takes Zoloft daily. Patient works for [employer] and lives by herself.  Review of Systems past history of GERD. Positive for depression on Zoloft x20 years stable. History of migraines. Previous surgeries include oophorectomy breast biopsy no problems with the procedures. Otherwise negative as it pertains HPI.  Objective:  Vital Signs: BP 100/68  Pulse 91  Ht 5' 7.25" (1.708 m)  Wt 120 lb (54.4 kg)  BMI 18.66 kg/m  Physical Exam  Constitutional:  Appearance: She is well-developed.  HENT:  Head: Normocephalic.  Right Ear: External ear normal.  Left Ear: External ear normal.  Eyes:  Pupils: Pupils are equal, round, and reactive to light.  Neck:  Thyroid: No thyromegaly.  Trachea: No tracheal deviation.  Cardiovascular:  Rate and Rhythm: Normal rate and regular rhythm.  Pulmonary:  Effort: Pulmonary effort is normal.  Breath sounds: Normal breath sounds. No wheezing or rales.  Abdominal:  Palpations: Abdomen is soft.  Skin:  General: Skin is warm and dry.  Neurological:  Mental Status: She is alert and oriented to person, place, and time.  Psychiatric:  Behavior: Behavior normal.   Ortho Exam Patient has mild swelling with fingertips right hand. Median sensation is intact. Patient says  Specialty Comments:  No specialty comments available.  Imaging:  No results found.  PMFS History:      Patient Active Problem List   Diagnosis Date Noted  . Closed fracture of right distal radius and ulna 12/08/2019  . 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 Familial hyperlipidemia 06/23/2019  . Major depression, recurrent, chronic ([place]) 11/01/2018  . Chronic prescription benzodiazepine use 11/01/2018  .  Situational anxiety 12/23/2015  . Generalized anxiety disorder 12/22/2015  . Ulcerative colitis ([place]) 12/22/2015       Past Medical History:  Diagnosis Date  . Arrhythmia   . Blood in stool   . Chicken pox   . Chronic prescription benzodiazepine use 11/01/2018  . Familial hyperlipidemia 06/23/2019   Excellent HDL  but LDL 197 06/2019; recommended statin  . GERD (gastroesophageal reflux disease)   . Major depression, recurrent, chronic ([place]) 11/01/2018   On zoloft since age 36  . Migraine   . Migraine headache 12/22/2015  . Reaction, adjustment, with depressed mood, prolonged 01/07/2016        Family History  Problem Relation Age of Onset  . Depression Mother   . Heart disease Father         Past Surgical History:  Procedure Laterality Date  . BREAST BIOPSY  2010   Social History       Occupational History  . Not on file  Tobacco Use  . Smoking status: Never Smoker  . Smokeless tobacco: Never Used  Substance and Sexual Activity  . Alcohol use: Never  . Drug use: Never  . Sexual activity: Never

## 2019-12-10 NOTE — Anesthesia Postprocedure Evaluation (Signed)
Anesthesia Post Note  Patient: Lacey Leon  Procedure(s) Performed: OPEN REDUCTION INTERNAL FIXATION (ORIF) DISTAL RADIAL FRACTURE RIGHT WRIST (Right )     Patient location during evaluation: PACU Anesthesia Type: Regional Level of consciousness: awake and alert Pain management: pain level controlled Vital Signs Assessment: post-procedure vital signs reviewed and stable Respiratory status: spontaneous breathing, nonlabored ventilation, respiratory function stable and patient connected to nasal cannula oxygen Cardiovascular status: stable and blood pressure returned to baseline Postop Assessment: no apparent nausea or vomiting Anesthetic complications: no    Last Vitals:  Vitals:   12/10/19 2045 12/10/19 2112  BP: 90/60 113/71  Pulse: 76 86  Resp: 13 17  Temp: 36.7 C 36.7 C  SpO2: 100% 100%    Last Pain:  Vitals:   12/10/19 2112  TempSrc: Oral  PainSc:                  [name]

## 2019-12-10 NOTE — Transfer of Care (Signed)
Immediate Anesthesia Transfer of Care Note  Patient: Lacey Leon  Procedure(s) Performed: OPEN REDUCTION INTERNAL FIXATION (ORIF) DISTAL RADIAL FRACTURE RIGHT WRIST (Right )  Patient Location: PACU  Anesthesia Type:MAC and Regional  Level of Consciousness: awake, alert  and oriented  Airway & Oxygen Therapy: Patient Spontanous Breathing  Post-op Assessment: Report given to RN and Post -op Vital signs reviewed and stable  Post vital signs: Reviewed and stable  Last Vitals:  Vitals Value Taken Time  BP 107/52 12/10/19 1909  Temp    Pulse 104 12/10/19 1910  Resp 17 12/10/19 1910  SpO2 100 % 12/10/19 1910  Vitals shown include unvalidated device data.  Last Pain:  Vitals:   12/10/19 1531  TempSrc:   PainSc: 5       Patients Stated Pain Goal: 3 (99/69/24 9324)  Complications: No apparent anesthesia complications

## 2019-12-10 NOTE — Anesthesia Procedure Notes (Signed)
Anesthesia Regional Block: Supraclavicular block   Pre-Anesthetic Checklist: ,, timeout performed, Correct Patient, Correct Site, Correct Laterality, Correct Procedure, Correct Position, site marked, Risks and benefits discussed,  Surgical consent,  Pre-op evaluation,  At surgeon's request and post-op pain management  Laterality: Right  Prep: chloraprep       Needles:  Injection technique: Single-shot  Needle Type: Echogenic Needle     Needle Length: 9cm      Additional Needles:   Procedures:,,,, ultrasound used (permanent image in chart),,,,  Narrative:  Start time: 12/10/2019 5:00 PM End time: 12/10/2019 5:09 PM Injection made incrementally with aspirations every 5 mL.  Performed by: Personally  Anesthesiologist: Myrtie Soman, MD  Additional Notes: Patient tolerated the procedure well without complications

## 2019-12-10 NOTE — Progress Notes (Signed)
Orthopedic Tech Progress Note Patient Details:  Lacey Leon 1961-08-16 [id number]  Ortho Devices Type of Ortho Device: Sling arm elevator Ortho Device/Splint Location: RUE Ortho Device/Splint Interventions: Ordered, Application, Adjustment   Post Interventions Patient Tolerated: Well Instructions Provided: Adjustment of device, Care of device   [place] 12/10/2019, 10:59 PM

## 2019-12-10 NOTE — Interval H&P Note (Signed)
History and Physical Interval Note:  12/10/2019 2:44 PM  Lacey Leon  has presented today for surgery, with the diagnosis of RIGHT DISTAL RADIUS FRACTURE.  The various methods of treatment have been discussed with the patient and family. After consideration of risks, benefits and other options for treatment, the patient has consented to  Procedure(s): OPEN REDUCTION INTERNAL FIXATION (ORIF) DISTAL RADIAL FRACTURE RIGHT WRIST (Right) as a surgical intervention.  The patient's history has been reviewed, patient examined, no change in status, stable for surgery.  I have reviewed the patient's chart and labs.  Questions were answered to the patient's satisfaction.     Marybelle Killings

## 2019-12-10 NOTE — Op Note (Signed)
Preop diagnosis: Right comminuted distal radius fracture with ulnar styloid fracture.  Postop diagnosis: Same  Procedure: Open reduction internal fixation right comminuted distal radius fracture fixation of 2pieces.  Surgeon: Rodell Perna, MD  Assistant: April Fulp, RNFA  Tourniquet: 250 times less than 1 hour  Implants: Biomet volar anatomic regular right plate.  Procedure after induction of preoperative block with IV sedation, oxygen supplement supplementation proximal arm tourniquet was applied splint was removed patient tolerated this well.  Arm was prepped preoperative Ancef prophylaxis DuraPrep was used timeout procedure was completed extremity sheets and drapes were applied.  Sterile skin marker was used and the arm was wrapped with an Esmarch tourniquet inflated  Incision was made directly over the FCR.  FCR sheath was opened and then pulling the FCR toward the radial artery for protection posterior aspect of the sheath was opened and the pronator was peeled from the radial side pushed toward the ulna.  Hohmann retractors were placed fracture was reduced some difficulty there was significant comminution dorsally with extension into the joint involvement of the distal radial ulnar joint.  Initial attempts did not have the distal fragment pushed toward the ulnar aspect far enough and this was repeated then held with a K wire until there was satisfactory position alignment plate was applied secured proximally with a K wire.  Slot was field drilled and a 14 mm screw was placed allowing adjustment distal proximal.  Distal holes were filled starting the ulnar aspect.  On C-arm visualization the proximal ulnar screw was close to the distal RU joint it appeared that was not in the joint however I switch the smooth pin and switched out with a variable angle screw angling away from the distal RU joint.  All screws were in good position and screws were filled from the ulnar aspect to the radial side and  then additional proximal screw was placed also 14 mm bicortical.  Final spot pictures were taken position alignment looked good.  Irrigation with saline solution tourniquet deflation.  Bipolar cautery was used as needed radial artery was normal.  Subtenons tissue reapproximated with 2-0 Vicryl and 3-0 Vicryl subcuticular closure.  Tincture benzoin Steri-Strips 4 x 4's 20 nurse web roll and short arm fiberglass splint was applied.  Patient tolerated procedure well transferred recovery in stable condition.  Patient has no family here she lives alone will stay overnight and be discharged in the morning picked up by her neighbor.

## 2019-12-10 NOTE — Anesthesia Preprocedure Evaluation (Signed)
Anesthesia Evaluation  Patient identified by MRN, date of birth, ID band Patient awake    Reviewed: Allergy & Precautions, H&P , NPO status , Patient's Chart, lab work & pertinent test results  Airway Mallampati: II  TM Distance: >3 FB Neck ROM: Full    Dental no notable dental hx. (+) Teeth Intact, Dental Advisory Given   Pulmonary neg pulmonary ROS,    Pulmonary exam normal breath sounds clear to auscultation       Cardiovascular negative cardio ROS   Rhythm:Regular Rate:Normal     Neuro/Psych  Headaches, Anxiety Depression    GI/Hepatic Neg liver ROS, PUD, GERD  ,  Endo/Other  negative endocrine ROS  Renal/GU negative Renal ROS  negative genitourinary   Musculoskeletal  (+) Arthritis , Osteoarthritis,    Abdominal   Peds  Hematology negative hematology ROS (+)   Anesthesia Other Findings   Reproductive/Obstetrics negative OB ROS                             Anesthesia Physical Anesthesia Plan  ASA: II  Anesthesia Plan: Regional and MAC   Post-op Pain Management:    Induction: Intravenous  PONV Risk Score and Plan: 2 and Propofol infusion, Midazolam, Dexamethasone and Ondansetron  Airway Management Planned: Simple Face Mask  Additional Equipment:   Intra-op Plan:   Post-operative Plan:   Informed Consent: I have reviewed the patients History and Physical, chart, labs and discussed the procedure including the risks, benefits and alternatives for the proposed anesthesia with the patient or authorized representative who has indicated his/her understanding and acceptance.     Dental advisory given  Plan Discussed with: CRNA  Anesthesia Plan Comments:         Anesthesia Quick Evaluation

## 2019-12-10 NOTE — Anesthesia Procedure Notes (Signed)
Anesthesia Procedure Image    

## 2019-12-11 ENCOUNTER — Encounter: Payer: Self-pay | Admitting: *Deleted

## 2019-12-11 DIAGNOSIS — S52591A Other fractures of lower end of right radius, initial encounter for closed fracture: Secondary | ICD-10-CM | POA: Diagnosis not present

## 2019-12-11 MED ORDER — PROPOFOL 10 MG/ML IV BOLUS
INTRAVENOUS | Status: AC
  Filled 2019-12-11: qty 20

## 2019-12-11 NOTE — Progress Notes (Signed)
Responded to page requesting prayer for patient who was in pain.  Initiated a relationship of care and ministry of presence as patient expressed her fear of going home today. Advocated for patient during PT provider visit by reflecting her concerns and clarifying her fears re going home before her pain level has been mitigated to a more tolerable level. And assisted patient's clarity in discussing with PT provider her fear of falling when she goes home where she lives alone because the pain meds will continue to make her woozy as they are now.  PT provider assured patient of follow up with nurse and PA. PT provider joined [place] in prayers of supplication that patient's pain be relieved and that her fears would heard and addressed.   Patient expressed having been heard.  Left patient with invitation to reach out to Chaplain at any time she feels the need for support.  De Burrs Chaplain Resident

## 2019-12-11 NOTE — Progress Notes (Signed)
I called patient to check on her progress this morning.  She states OT came in when she was on the bedpan without knocking and she felt this was very rude.  Patient states the medication has made her woozy.  She has not had any morphine I stop the morphine and discontinued the tramadol she has Tylenol ordered and also has Norco ordered but could use only half tablet instead of a whole tablet.  Patient is concerned about walking and I talked with her nurse Velna Hatchet will get physical therapy to get her up and ambulate.  We will also have office of patient experience talk with her.  If she is able to safely ambulate is not a fall risk she can go home today if there is problems and it is felt that she is not safe after therapy evaluation she will need to stay.

## 2019-12-11 NOTE — Progress Notes (Signed)
   Subjective: 1 Day Post-Op Procedure(s) (LRB): OPEN REDUCTION INTERNAL FIXATION (ORIF) DISTAL RADIAL FRACTURE RIGHT WRIST (Right) Patient reports pain as mild and moderate.  " I don't have anyone to take me home, I want to talk to the social worker please"  Objective: Vital signs in last 24 hours: Temp:  [98 F (36.7 C)-98.8 F (37.1 C)] 98.5 F (36.9 C) (03/04 0326) Pulse Rate:  [73-101] 87 (03/04 0514) Resp:  [8-21] 16 (03/04 0326) BP: (83-113)/(43-71) 93/43 (03/04 0514) SpO2:  [98 %-100 %] 99 % (03/04 0326) Weight:  [54.9 kg] 54.9 kg (03/03 1424)  Intake/Output from previous day: 03/03 0701 - 03/04 0700 In: 1340 [P.O.:440; I.V.:900] Out: 625 [Urine:600; Blood:25] Intake/Output this shift: No intake/output data recorded.  Recent Labs    12/10/19 1600  HGB 12.4   Recent Labs    12/10/19 1600  WBC 4.3  RBC 4.87  HCT 38.9  PLT 168   Recent Labs    12/10/19 1600  NA 136  K 4.0  CL 102  CO2 24  BUN 14  CREATININE 0.69  GLUCOSE 96  CALCIUM 9.2   No results for input(s): LABPT, INR in the last 72 hours.  PE:  Can move fingers splint intact.  No results found.  Assessment/Plan: 1 Day Post-Op Procedure(s) (LRB): OPEN REDUCTION INTERNAL FIXATION (ORIF) DISTAL RADIAL FRACTURE RIGHT WRIST (Right) Plan: discharge home.   Marybelle Killings 12/11/2019, 7:44 AM

## 2019-12-11 NOTE — Progress Notes (Signed)
OT Cancellation Note  Patient Details Name: Lacey Leon MRN: [id number] DOB: July 23, 1961   Cancelled Treatment:    Reason Eval/Treat Not Completed: Other (comment);Patient declined, no reason specified. Attempted x 3 to see pt today. On first attempt, chaplain in with pt although OT explained to pt that I wood return later for her assessment and and that she would be getting OOB. On second attempt, pt eating lunch. OT returned a third time and pt on bed pan. OT encouraged pt to get OOB to walk to bathroom or to transfer to [facility], however pt became very agiated, raising her voice and stating that she was too loopy and sleepy to do anything and that OT had never mentioned to her previously that she would be getting OOB and for OT to get out of her room.   Emmit Alexanders [facility] 12/11/2019, 12:36 PM

## 2019-12-11 NOTE — Progress Notes (Signed)
Orthopedic Tech Progress Note Patient Details:  Lacey Leon 02-25-1961 [id number] RN called requesting I come up and make sure ARM ELEVATOR was on patient correctly   Patient ID: Lacey Leon, female   DOB: 1961/09/01, 59 y.o.   MRN: [id number]   Lacey Leon 12/11/2019, 5:32 PM

## 2019-12-11 NOTE — Clinical Social Work Note (Signed)
CSW spoke with patient at the bedside to discuss going home today. MD Lorin Mercy awaiting PT assessment before he makes decision to go home.  Patient states that at this time she does not feel safe as she is on narcotics and she has felt "woozy" all day long. Patient states that she has mentioned to several people that she does not feel like herself. Patient voiced several concerns to CSW, but stated that she plans to call patient experience as well.   Patient states that she does not have a ride home if she were to be discharged home. CSW spoke with patient about getting an uber and asked her to sign the rider waiver and release of liability form. Patient stated that she did not feel comfortable signing the form at this time, as she feels that she is not able to make an informed decision due to her being on pain meds and being "woozy".   CSW informed patient that she would inform Dr. Lorin Mercy of our conversation and await PT recommendations before moving further.   Patient also requested information regarding grocery delivery programs. CSW will add information such as [employer], [employer] delivery, and [employer]ipt.

## 2019-12-11 NOTE — Evaluation (Signed)
Physical Therapy Evaluation Patient Details Name: Lacey Leon MRN: [id number] DOB: 1961-07-23 Today's Date: 12/11/2019   History of Present Illness  Pt is a 59 y/o female s/p ORIF of R wrist fx. PMH includes GERD.    Clinical Impression  Pt is s/p surgery above with deficits below. Pt reports feeling woozy and fell asleep immediately following transfer to chair. Required min to min guard A for transfer to chair. Pt reports dizziness with transfers. Feel pt will require continued PT prior to return home, as she lives alone and reports she has no one will assist. Do feel that patient will progress well with mobility once feeling better, and may require HHPT at d/c. Will continue to follow acutely and update recommendations based on patient progression.     Follow Up Recommendations Other (comment)(TBD pending mobility progression)    Equipment Recommendations  Other (comment)(TBD)    Recommendations for Other Services       Precautions / Restrictions Precautions Precautions: Fall Precaution Comments: Pt had fall that caused wrist fx.  Required Braces or Orthoses: Sling Restrictions Weight Bearing Restrictions: Yes RUE Weight Bearing: Non weight bearing      Mobility  Bed Mobility Overal bed mobility: Needs Assistance Bed Mobility: Supine to Sit     Supine to sit: Min assist     General bed mobility comments: Min A for trunk elevation. Pt reporting dizziness upon sitting, and also reporting she felt like she was going to fall asleep.   Transfers Overall transfer level: Needs assistance Equipment used: 1 person hand held assist Transfers: Sit to/from [facility] Sit to Stand: Min assist Stand pivot transfers: Min guard       General transfer comment: Min A for steadying assist to stand. Min guard for transfer. Pt reports she feels too sleepy for further mobility.   Ambulation/Gait                Stairs            Wheelchair Mobility     Modified Rankin (Stroke Patients Only)       Balance Overall balance assessment: Needs assistance Sitting-balance support: No upper extremity supported;Feet supported Sitting balance-Leahy Scale: Fair     Standing balance support: Single extremity supported;During functional activity Standing balance-Leahy Scale: Poor Standing balance comment: Reliant on LUE support                              Pertinent Vitals/Pain Pain Assessment: 0-10 Pain Score: 10-Worst pain ever Pain Location: R wrist Pain Descriptors / Indicators: Aching;Grimacing;Operative site guarding Pain Intervention(s): Limited activity within patient's tolerance;Monitored during session;Repositioned    Home Living Family/patient expects to be discharged to:: Private residence Living Arrangements: Alone Available Help at Discharge: Other (Comment)(reports no one) Type of Home: House Home Access: Level entry     Home Layout: One level Home Equipment: None      Prior Function Level of Independence: Independent               Hand Dominance        Extremity/Trunk Assessment   Upper Extremity Assessment Upper Extremity Assessment: RUE deficits/detail RUE Deficits / Details: RUE immobilized at wrist. Pt complaining of increased pain.     Lower Extremity Assessment Lower Extremity Assessment: Overall WFL for tasks assessed    Cervical / Trunk Assessment Cervical / Trunk Assessment: Normal  Communication   Communication: No difficulties  Cognition Arousal/Alertness: Suspect due  to medications Behavior During Therapy: Anxious Overall Cognitive Status: No family/caregiver present to determine baseline cognitive functioning                                 General Comments: Pt stated how sleepy her medications are making her dizzy and sleepy. Falling asleep immediately after transfer to chair.       General Comments General comments (skin integrity, edema, etc.): Pt  voicing concerns about going home alone.     Exercises     Assessment/Plan    PT Assessment Patient needs continued PT services  PT Problem List Decreased activity tolerance;Decreased balance;Decreased mobility;Pain       PT Treatment Interventions Gait training;Functional mobility training;Stair training;Therapeutic activities;Therapeutic exercise;Balance training;Patient/family education    PT Goals (Current goals can be found in the Care Plan section)  Acute Rehab PT Goals Patient Stated Goal: to be able to care fir herself at home PT Goal Formulation: With patient Time For Goal Achievement: 12/25/19 Potential to Achieve Goals: Good    Frequency Min 5X/week   Barriers to discharge Decreased caregiver support      Co-evaluation               AM-PAC PT "6 Clicks" Mobility  Outcome Measure Help needed turning from your back to your side while in a flat bed without using bedrails?: A Little Help needed moving from lying on your back to sitting on the side of a flat bed without using bedrails?: A Little Help needed moving to and from a bed to a chair (including a wheelchair)?: A Little Help needed standing up from a chair using your arms (e.g., wheelchair or bedside chair)?: A Little Help needed to walk in hospital room?: A Little Help needed climbing 3-5 steps with a railing? : A Lot 6 Click Score: 17    End of Session Equipment Utilized During Treatment: Gait belt;Other (comment)(sling) Activity Tolerance: Patient limited by lethargy;Patient limited by pain Patient left: in chair;with call bell/phone within reach Nurse Communication: Mobility status;Other (comment)(Pt to use 3 in 1) PT Visit Diagnosis: Other abnormalities of gait and mobility (R26.89);Unsteadiness on feet (R26.81);Pain Pain - Right/Left: Right Pain - part of body: (wrist)    Time: 1350-1415 PT Time Calculation (min) (ACUTE ONLY): 25 min   Charges:   PT Evaluation $PT Eval Low Complexity: 1  Low PT Treatments $Therapeutic Activity: 8-22 mins        Lou Miner, DPT  Acute Rehabilitation Services  Pager: [phone] Office: [phone]   Rudean Hitt 12/11/2019, 4:03 PM

## 2019-12-11 NOTE — Discharge Instructions (Signed)
Keep hand elevated above heart to help with pain and swelling . See Dr. Lorin Mercy in one week for dressing change.    Grocery Delivery Programs  *[employer] is an [place] that operates a grocery delivery and pick-up service in the [place] and [place]. The company offers its services via a website and mobile app. The service allows customers to order groceries from participating retailers with the shopping being done by a personal shopper  *[employer] Through a user-friendly app and a local network of reliable shoppers, [employer] connects members to fresh groceries and everyday essentials. Saving time, fuel and headspace, next-hour, same day grocery delivery is quickly becoming an everyday necessity for people looking for an extra few hours and intentional food choices.  *[employer] grocery delivery To order, customers visit [email or web address] or go to the existing [employer] app to build an Lobbyist and place an order, selecting the most convenient time for the order to be delivered.

## 2019-12-11 NOTE — Progress Notes (Signed)
The patient has multiple complaints:  She called out and said that she needed her phone closer to her- her room phone was placed within reach along with her cell phone.  The patient has inappropriate behavior, appears anxious and becomes very irritated when approached and attempting to help her.  "I'm on narcotics and I don't know what I want."  "Thanks for fixing the problems."  When suggested to bring her anti anxiety mediation, she refuses and becomes loud and appears aggravated.  Assurance given to the patient that if she needs assistance, please call.

## 2019-12-12 DIAGNOSIS — S52591A Other fractures of lower end of right radius, initial encounter for closed fracture: Secondary | ICD-10-CM | POA: Diagnosis not present

## 2019-12-12 NOTE — Progress Notes (Signed)
Physical Therapy Treatment Patient Details Name: Lacey Leon MRN: [id number] DOB: November 07, 1960 Today's Date: 12/12/2019    History of Present Illness Pt is a 59 y/o female s/p ORIF of R wrist fx. PMH includes GERD.      PT Comments    Pt pleasant sitting EOB and reports feeling significantly better since pain medication has begun clearing her system. Pt reports being very foggy yesterday and still not feeling 100%. Pt with significantly improved function who was able to mobilize in room and walk hallway without assist although pt requested HHA for reassurance with gait. Pt educated for sling use and elevation as well as safety awareness and mobility progression. Pt appropriate for return home.     Follow Up Recommendations  No PT follow up     Equipment Recommendations  None recommended by PT    Recommendations for Other Services       Precautions / Restrictions Precautions Precautions: Fall Precaution Comments: Pt had fall that caused wrist fx.  Required Braces or Orthoses: Sling Restrictions Weight Bearing Restrictions: Yes RUE Weight Bearing: Non weight bearing    Mobility  Bed Mobility               General bed mobility comments: pt sitting EOB on arrival  Transfers Overall transfer level: Needs assistance     Sit to Stand: Supervision         General transfer comment: did not stand this session; was min A yesterday with PT  Ambulation/Gait Ambulation/Gait assistance: Min guard Gait Distance (Feet): 500 Feet Assistive device: None;1 person hand held assist(pt requesting HHA due to still feeling off from medication) Gait Pattern/deviations: WFL(Within Functional Limits)   Gait velocity interpretation: >4.37 ft/sec, indicative of normal walking speed     Stairs             Wheelchair Mobility    Modified Rankin (Stroke Patients Only)       Balance Overall balance assessment: History of Falls;No apparent balance deficits (not formally  assessed)                                          Cognition Arousal/Alertness: Awake/alert Behavior During Therapy: WFL for tasks assessed/performed Overall Cognitive Status: Within Functional Limits for tasks assessed                                       Exercises     General Comments        Pertinent Vitals/Pain Pain Assessment: Faces Pain Score: 4  Faces Pain Scale: Hurts even more Pain Location: R wrist Pain Descriptors / Indicators: Aching;Guarding Pain Intervention(s): Limited activity within patient's tolerance;Monitored during session;Repositioned    Home Living Family/patient expects to be discharged to:: Private residence Living Arrangements: Alone           Home Equipment: None Additional Comments: will likely need to sponge bathe    Prior Function Level of Independence: Independent          PT Goals (current goals can now be found in the care plan section) Acute Rehab PT Goals Patient Stated Goal: to be able to care fir herself at home Progress towards PT goals: Progressing toward goals    Frequency           PT Plan Current plan  remains appropriate    Co-evaluation              AM-PAC PT "6 Clicks" Mobility   Outcome Measure  Help needed turning from your back to your side while in a flat bed without using bedrails?: A Little Help needed moving from lying on your back to sitting on the side of a flat bed without using bedrails?: A Little Help needed moving to and from a bed to a chair (including a wheelchair)?: A Little Help needed standing up from a chair using your arms (e.g., wheelchair or bedside chair)?: A Little Help needed to walk in hospital room?: A Little Help needed climbing 3-5 steps with a railing? : A Little 6 Click Score: 18    End of Session Equipment Utilized During Treatment: Other (comment)(sling) Activity Tolerance: Patient tolerated treatment well Patient left: in  chair;with call bell/phone within reach Nurse Communication: Mobility status PT Visit Diagnosis: Other abnormalities of gait and mobility (R26.89)     Time: 0623-7628 PT Time Calculation (min) (ACUTE ONLY): 20 min  Charges:  $Gait Training: 8-22 mins                     [place] Pager: [phone] Office: [place] 12/12/2019, 12:23 PM

## 2019-12-12 NOTE — Progress Notes (Signed)
Anxious. Walked with PT , needs ride home, wants to talk with Education officer, museum. Office one week. Just taking tylenol for pain and can use ice and elevation. Office one week. Sensation intact to fingers moving all fingers, EPL working well.

## 2019-12-12 NOTE — TOC Transition Note (Signed)
Transition of Care [facility]) - CM/SW Discharge Note   Patient Details  Name: Lacey Leon MRN: [id number] Date of Birth: 07/13/1961  Transition of Care [facility]) CM/SW Contact:  Atilano Median, LCSW Phone Number: 12/12/2019, 2:27 PM   Clinical Narrative:    Discharged home. Cab voucher provided. Patient aware and agreeable to this plan. No other needs at this time. Case closed to this CSW.    Final next level of care: Home/Self Care Barriers to Discharge: Barriers Resolved   Patient Goals and CMS Choice        Discharge Placement                       Discharge Plan and Services                                     Social Determinants of Health (SDOH) Interventions     Readmission Risk Interventions No flowsheet data found.

## 2019-12-12 NOTE — Plan of Care (Signed)

## 2019-12-12 NOTE — Progress Notes (Signed)
Provided discharge education/instructions, all questions and concerns addressed, Pt not in distress, transport home arranged, to discharge with belongings.

## 2019-12-12 NOTE — Evaluation (Signed)
Occupational Therapy Evaluation Patient Details Name: Lacey Leon MRN: [id number] DOB: 1961/05/08 Today's Date: 12/12/2019    History of Present Illness Pt is a 59 y/o female s/p ORIF of R wrist fx. PMH includes GERD.     Clinical Impression   Pt was admitted for the above injury/sx.  She lives alone and reports that she has no support.  She fatiqued easily and felt that medications were making her tired.  Pt is flexible and was able to don sock and sling--IV was in the way for the second.  Worked on ted hose; RN clarified that she will not have to wear these. This was quite challenging.  Educated on sequence for dressing.  Will continue to follow in acute setting with mod I level goals. Pt currently needs min A overall    Follow Up Recommendations  Home health OT, if another service is present as HHOT cannot stand alone     Equipment Recommendations  None recommended by OT    Recommendations for Other Services       Precautions / Restrictions Precautions Precautions: Fall Precaution Comments: Pt had fall that caused wrist fx.  Required Braces or Orthoses: Sling Restrictions Weight Bearing Restrictions: Yes RUE Weight Bearing: Non weight bearing      Mobility Bed Mobility               General bed mobility comments: supervision   Transfers                 General transfer comment: did not stand this session; was min A yesterday with PT    Balance                                           ADL either performed or assessed with clinical judgement   ADL Overall ADL's : Needs assistance/impaired Eating/Feeding: Set up   Grooming: Set up   Upper Body Bathing: Minimal assistance   Lower Body Bathing: Minimal assistance   Upper Body Dressing : Minimal assistance   Lower Body Dressing: Minimal assistance                 General ADL Comments: worked on donning socks at EOB.  pt has flexibility to reach feet. She had difficulty  with ted hose, and RN clarified with MD; she will not have to wear these at home. Educated on sequence for dressing. Pt did not want to bathe at this time. She prefers to work with PT first.  Pt does fatique easily--she feels medication is making her sleepy     Vision         Perception     Praxis      Pertinent Vitals/Pain Pain Assessment: Faces Faces Pain Scale: Hurts even more Pain Location: R wrist Pain Descriptors / Indicators: Aching;Grimacing;Operative site guarding Pain Intervention(s): Limited activity within patient's tolerance;Monitored during session;Premedicated before session;Repositioned     Hand Dominance Right   Extremity/Trunk Assessment Upper Extremity Assessment RUE Deficits / Details: immobilized at wrist. Able to move fingers; educated on P/AROM           Communication Communication Communication: No difficulties   Cognition Arousal/Alertness: Awake/alert Behavior During Therapy: WFL for tasks assessed/performed  General Comments: medications making pt sleepy; she asked same questions a couple of times.     General Comments       Exercises Exercises: Other exercises Other Exercises Other Exercises: P/AROM R fingers and educated on positioning for edema management   Shoulder Instructions      Home Living Family/patient expects to be discharged to:: Private residence Living Arrangements: Alone                 Bathroom Shower/Tub: Teacher, early years/pre: Standard     Home Equipment: None   Additional Comments: will likely need to sponge bathe      Prior Functioning/Environment Level of Independence: Independent                 OT Problem List: Decreased strength;Decreased activity tolerance;Pain;Impaired UE functional use      OT Treatment/Interventions: Self-care/ADL training;Therapeutic exercise;Patient/family education;Therapeutic activities    OT  Goals(Current goals can be found in the care plan section) Acute Rehab OT Goals Patient Stated Goal: to be able to care fir herself at home OT Goal Formulation: With patient Time For Goal Achievement: 12/19/19 Potential to Achieve Goals: Good ADL Goals Additional ADL Goal #1: pt will perform adls and toileting at mod I level, extra time Additional ADL Goal #2: pt will be independent with finger P/AROM and positioning for edema management  OT Frequency: Min 2X/week   Barriers to D/C:            Co-evaluation              AM-PAC OT "6 Clicks" Daily Activity     Outcome Measure Help from another person eating meals?: A Little Help from another person taking care of personal grooming?: A Little Help from another person toileting, which includes using toliet, bedpan, or urinal?: A Little Help from another person bathing (including washing, rinsing, drying)?: A Little Help from another person to put on and taking off regular upper body clothing?: A Little Help from another person to put on and taking off regular lower body clothing?: A Little 6 Click Score: 18   End of Session    Activity Tolerance: Patient limited by fatigue Patient left: in bed;with call bell/phone within reach  OT Visit Diagnosis: Muscle weakness (generalized) (M62.81);Pain Pain - Right/Left: Right Pain - part of body: Hand                Time: 6222-9798 OT Time Calculation (min): 29 min Charges:  OT General Charges $OT Visit: 1 Visit OT Evaluation $OT Eval Low Complexity: 1 Low OT Treatments $Self Care/Home Management : 8-22 mins  Jesiel Garate S, OTR/L Acute Rehabilitation Services 12/12/2019  Marcelle Bebout 12/12/2019, 10:41 AM

## 2019-12-19 ENCOUNTER — Encounter: Payer: Self-pay | Admitting: Orthopaedic Surgery

## 2019-12-19 ENCOUNTER — Ambulatory Visit (INDEPENDENT_AMBULATORY_CARE_PROVIDER_SITE_OTHER): Payer: No Typology Code available for payment source | Admitting: Orthopaedic Surgery

## 2019-12-19 ENCOUNTER — Other Ambulatory Visit: Payer: Self-pay

## 2019-12-19 ENCOUNTER — Ambulatory Visit (INDEPENDENT_AMBULATORY_CARE_PROVIDER_SITE_OTHER): Payer: No Typology Code available for payment source

## 2019-12-19 VITALS — BP 104/61 | HR 80 | Ht 67.0 in | Wt 121.0 lb

## 2019-12-19 DIAGNOSIS — M25511 Pain in right shoulder: Secondary | ICD-10-CM

## 2019-12-19 DIAGNOSIS — M25512 Pain in left shoulder: Secondary | ICD-10-CM

## 2019-12-19 DIAGNOSIS — M542 Cervicalgia: Secondary | ICD-10-CM

## 2019-12-19 DIAGNOSIS — S52501A Unspecified fracture of the lower end of right radius, initial encounter for closed fracture: Secondary | ICD-10-CM | POA: Diagnosis not present

## 2019-12-19 DIAGNOSIS — S52601A Unspecified fracture of lower end of right ulna, initial encounter for closed fracture: Secondary | ICD-10-CM

## 2019-12-19 NOTE — Progress Notes (Signed)
Post-Op Visit Note   Patient: Lacey Leon           Date of Birth: 1961-08-17           MRN: [id number] Visit Date: 12/19/2019 PCP: Leamon Arnt, MD   Assessment & Plan: Patient is gone back to work mostly doing one-handed work.  She is right-hand dominant but has been using the mouse with her left hand and trying to type left-handed and then peck the key she normally do with her right hand.  She is in increased pain in her neck and shoulders and is requesting some therapy of her shoulders and neck which we will set up at [place] since she lives out toward [facility].  She not taking any pain medication.  Recheck 4 weeks repeat x-rays.  IP joint motion active and passive is still present and should continue to keep thumb IP joint moving to prevent adhesions for the thumb extensor.  Chief Complaint:  Chief Complaint  Patient presents with  . Right Wrist - Routine Post Op    12/10/2019 ORIF Right Distal Radius   Visit Diagnoses:  1. Closed fracture of distal ends of right radius and ulna, initial encounter     Plan: Therapy for neck and shoulders.  Wrist splint application to immobilize the wrist long wrist splint.  Return 4 weeks.  Repeat x-rays on return.  Follow-Up Instructions: Return in about 4 weeks (around 01/16/2020).   Orders:  Orders Placed This Encounter  Procedures  . XR Wrist 2 Views Right   No orders of the defined types were placed in this encounter.   Imaging: XR Wrist 2 Views Right  Result Date: 12/19/2019 2 view x-rays right wrist obtained and reviewed.  This shows distal radius comminuted intra-articular fracture.  Dorsally distally displaced fragment is still noted.  Radius is out to length and significant improvement of volar tilt. Impression: Satisfactory postop right comminuted distal radius x-rays.   PMFS History: Patient Active Problem List   Diagnosis Date Noted  . Closed fracture of right distal radius 12/10/2019  . Closed fracture of right  distal radius and ulna 12/08/2019  . 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 Familial hyperlipidemia 06/23/2019  . Major depression, recurrent, chronic ([place]) 11/01/2018  . Chronic prescription benzodiazepine use 11/01/2018  . Situational anxiety 12/23/2015  . Generalized anxiety disorder 12/22/2015  . Ulcerative colitis ([place]) 12/22/2015   Past Medical History:  Diagnosis Date  . Anxiety   . Arrhythmia   . Arthritis   . Blood in stool   . Chicken pox   . Chronic prescription benzodiazepine use 11/01/2018  . Complication of anesthesia    unable to void for several days after anesthesia  . Familial hyperlipidemia 06/23/2019   Excellent HDL but LDL 197 06/2019; recommended statin  . GERD (gastroesophageal reflux disease)   . Major depression, recurrent, chronic ([place]) 11/01/2018   On zoloft since age 4  . Migraine   . Migraine headache 12/22/2015  . Mitral valve prolapse   . Pneumonia   . Reaction, adjustment, with depressed mood, prolonged 01/07/2016  . Ulcerative colitis ([place])     Family History  Problem Relation Age of Onset  . Depression Mother   . Heart disease Father     Past Surgical History:  Procedure Laterality Date  . BREAST BIOPSY  2010  . COLONOSCOPY    . OPEN REDUCTION INTERNAL FIXATION (ORIF) DISTAL RADIAL FRACTURE Right 12/10/2019   Procedure: OPEN REDUCTION INTERNAL FIXATION (ORIF) DISTAL RADIAL  FRACTURE RIGHT WRIST;  Surgeon: Marybelle Killings, MD;  Location: [place];  Service: Orthopedics;  Laterality: Right;  . WISDOM TOOTH EXTRACTION     Social History   Occupational History  . Not on file  Tobacco Use  . Smoking status: Never Smoker  . Smokeless tobacco: Never Used  Substance and Sexual Activity  . Alcohol use: Never  . Drug use: Never  . Sexual activity: Never

## 2019-12-22 ENCOUNTER — Ambulatory Visit: Payer: No Typology Code available for payment source | Admitting: Family Medicine

## 2019-12-23 NOTE — Discharge Summary (Signed)
Patient ID: Merita Hawks MRN: [id number] DOB/AGE: September 08, 1961 59 y.o.  Admit date: 12/10/2019 Discharge date: 12/23/2019  Admission Diagnoses:  Active Problems:   Closed fracture of right distal radius   Discharge Diagnoses:  Active Problems:   Closed fracture of right distal radius  status post Procedure(s): OPEN REDUCTION INTERNAL FIXATION (ORIF) DISTAL RADIAL FRACTURE RIGHT WRIST  Past Medical History:  Diagnosis Date  . Anxiety   . Arrhythmia   . Arthritis   . Blood in stool   . Chicken pox   . Chronic prescription benzodiazepine use 11/01/2018  . Complication of anesthesia    unable to void for several days after anesthesia  . Familial hyperlipidemia 06/23/2019   Excellent HDL but LDL 197 06/2019; recommended statin  . GERD (gastroesophageal reflux disease)   . Major depression, recurrent, chronic ([place]) 11/01/2018   On zoloft since age 73  . Migraine   . Migraine headache 12/22/2015  . Mitral valve prolapse   . Pneumonia   . Reaction, adjustment, with depressed mood, prolonged 01/07/2016  . Ulcerative colitis ([place])     Surgeries: Procedure(s): OPEN REDUCTION INTERNAL FIXATION (ORIF) DISTAL RADIAL FRACTURE RIGHT WRIST on 12/10/2019   Consultants:   Discharged Condition: Improved  Hospital Course: Mishael Krysiak is an 59 y.o. female who was admitted 12/10/2019 for operative treatment of right distal radius fracture. Patient failed conservative treatments (please see the history and physical for the specifics) and had severe unremitting pain that affects sleep, daily activities and work/hobbies. After pre-op clearance, the patient was taken to the operating room on 12/10/2019 and underwent  Procedure(s): OPEN REDUCTION INTERNAL FIXATION (ORIF) DISTAL RADIAL FRACTURE RIGHT WRIST.    Patient was given perioperative antibiotics:  Anti-infectives (From admission, onward)   Start     Dose/Rate Route Frequency Ordered Stop   12/10/19 1515  ceFAZolin (ANCEF) IVPB 2g/100 mL premix      2 g 200 mL/hr over 30 Minutes Intravenous On call to O.R. 12/10/19 1458 12/10/19 1800   12/10/19 1503  ceFAZolin (ANCEF) 2-4 GM/100ML-% IVPB    Note to Pharmacy: Tressia Miners Ch: cabinet override      12/10/19 1503 12/11/19 0314       Patient was given sequential compression devices and early ambulation to prevent DVT.   Patient benefited maximally from hospital stay and there were no complications. At the time of discharge, the patient was urinating/moving their bowels without difficulty, tolerating a regular diet, pain is controlled with oral pain medications and they have been cleared by PT/OT.   Recent vital signs: No data found.   Recent laboratory studies: No results for input(s): WBC, HGB, HCT, PLT, NA, K, CL, CO2, BUN, CREATININE, GLUCOSE, INR, CALCIUM in the last 72 hours.  Invalid input(s): PT, 2   Discharge Medications:   Allergies as of 12/12/2019      Reactions   Ciprofloxacin Other (See Comments)   ulcer   Clarithromycin Other (See Comments)   Ulcer   Other    ORAL ANTIBIOTICS      Medication List    TAKE these medications   ergocalciferol 200 MCG/ML drops Commonly known as: DRISDOL Take 8,000 Units by mouth daily. Notes to patient: Resume home regimen   fluticasone 50 MCG/ACT nasal spray Commonly known as: FLONASE Place 1 spray into both nostrils daily as needed for allergies or rhinitis. Notes to patient: Resume home regimen   HYDROcodone-acetaminophen 5-325 MG tablet Commonly known as: Norco Take 1 tablet by mouth every 6 (six) hours as  needed for severe pain. Notes to patient: Last dose given 03/04 07:01AM   LORazepam 1 MG tablet Commonly known as: ATIVAN Take 1 tablet (1 mg total) by mouth 2 (two) times daily as needed for anxiety. What changed: when to take this Notes to patient: Last dose given 03/04 09:03PM   naproxen sodium 550 MG tablet Commonly known as: ANAPROX Take 550 mg by mouth 2 (two) times daily as needed for  headache. Notes to patient: Resume home regimen   sertraline 100 MG tablet Commonly known as: ZOLOFT Take 100 mg by mouth at bedtime.       Diagnostic Studies: DG Forearm Right  Result Date: 12/07/2019 CLINICAL DATA:  Pain and fall EXAM: RIGHT FOREARM - 2 VIEW COMPARISON:  None. FINDINGS: Comminuted mildly displaced impacted distal radius fracture seen. Mildly displaced ulnar styloid fracture is noted. IMPRESSION: Comminuted impacted mildly displaced distal radius and ulnar styloid fractures. Electronically Signed   By: Prudencio Pair M.D.   On: 12/07/2019 03:00   DG Wrist Complete Right  Result Date: 12/07/2019 CLINICAL DATA:  Fall and pain EXAM: RIGHT WRIST - COMPLETE 3+ VIEW COMPARISON:  None. FINDINGS: There is a comminuted impacted mildly displaced distal radius fracture with apex dorsal angulation. Mildly displaced ulnar styloid fracture seen. There is widening of the radioulnar joint. Overlying soft tissue swelling is seen. IMPRESSION: Comminuted impacted mildly displaced distal radius fracture. Mildly displaced ulnar styloid fracture. Electronically Signed   By: Prudencio Pair M.D.   On: 12/07/2019 02:59   XR Wrist 2 Views Right  Result Date: 12/19/2019 2 view x-rays right wrist obtained and reviewed.  This shows distal radius comminuted intra-articular fracture.  Dorsally distally displaced fragment is still noted.  Radius is out to length and significant improvement of volar tilt. Impression: Satisfactory postop right comminuted distal radius x-rays.     Follow-up Information    Marybelle Killings, MD Follow up in 1 week(s).   Specialty: Orthopedic Surgery Contact information: [address] [place] [address] [phone]           Discharge Plan:  discharge to home Disposition:     Signed: Benjiman Core  12/23/2019, 2:33 PM

## 2019-12-25 ENCOUNTER — Telehealth: Payer: Self-pay | Admitting: Orthopaedic Surgery

## 2019-12-25 NOTE — Telephone Encounter (Signed)
Patient called requesting a call back from Dr. Lorin Mercy nurse. Patient needs to make sure Dr. Lorin Mercy wants her to have an appointment 4 weeks from last visit date. Patient did not want to make an appointment until confirmation. Patient phone number is [phone].

## 2019-12-25 NOTE — Telephone Encounter (Signed)
I called patient and made follow up appointment. She had questions regarding x-rays, splint care, etc. I answered all questions.

## 2019-12-29 ENCOUNTER — Telehealth: Payer: Self-pay | Admitting: Family Medicine

## 2019-12-29 NOTE — Telephone Encounter (Addendum)
Patient would like to know why she is having to have a 6 month fu on 3/24.  Patient recently had wrist surgery and does not feel like she can come into the office for a visit. Patient has cancelled appt for right now.

## 2019-12-29 NOTE — Telephone Encounter (Signed)
LVM for patient to return call. 

## 2019-12-30 ENCOUNTER — Ambulatory Visit: Payer: No Typology Code available for payment source | Attending: Orthopaedic Surgery | Admitting: Physical Therapy

## 2019-12-30 ENCOUNTER — Other Ambulatory Visit: Payer: Self-pay

## 2019-12-30 DIAGNOSIS — M25512 Pain in left shoulder: Secondary | ICD-10-CM | POA: Insufficient documentation

## 2019-12-30 DIAGNOSIS — M6281 Muscle weakness (generalized): Secondary | ICD-10-CM | POA: Insufficient documentation

## 2019-12-30 DIAGNOSIS — M542 Cervicalgia: Secondary | ICD-10-CM | POA: Diagnosis present

## 2019-12-30 DIAGNOSIS — R293 Abnormal posture: Secondary | ICD-10-CM | POA: Insufficient documentation

## 2019-12-30 DIAGNOSIS — M25511 Pain in right shoulder: Secondary | ICD-10-CM | POA: Insufficient documentation
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
## 2019-12-30 NOTE — Telephone Encounter (Signed)
Spoke with patient. She will reschedule a 6 month follow up visit.

## 2019-12-30 NOTE — Patient Instructions (Signed)
Access Code: [email or web address] URL: [email or web address] Date: 12/30/2019 Prepared by: Madelyn Flavors  Exercises Thoracic Extension Mobilization on Foam Roll - 2 x daily - 7 x weekly - 2 sets - 5 reps - you may hold the position for a stretch hold Seated Thoracic Extension with Hands Behind Neck - 3 x daily - 7 x weekly - 1 sets - 3 reps Seated Levator Scapulae Stretch - 2 x daily - 7 x weekly - 3 reps - 30 hold Seated Upper Trapezius Stretch - 2 x daily - 7 x weekly - 3 reps - 30 hold Sternocleidomastoid Stretch - 2 x daily - 7 x weekly - 3 reps - 30 hold Standing Isometric Cervical Sidebending with Manual Resistance - 1 x daily - 7 x weekly - 5 reps - 1 sets - 10 sec hold hold Standing Isometric Cervical Flexion with Manual Resistance - 1 x daily - 7 x weekly - 5 reps - 1 sets - 10 sec hold Standing Isometric Cervical Extension with Manual Resistance - 1 x daily - 7 x weekly - 5 reps - 1 sets - 10 sec hold Seated Isometric Cervical Rotation - 1 x daily - 7 x weekly - 5 reps - 1 sets - 10 sec hold

## 2019-12-31 ENCOUNTER — Telehealth: Payer: Self-pay | Admitting: Physical Therapy

## 2019-12-31 ENCOUNTER — Ambulatory Visit: Payer: No Typology Code available for payment source | Admitting: Family Medicine

## 2019-12-31 NOTE — Telephone Encounter (Signed)
Returned patient's phone call. She is very sore and has had a migraine since yesterday's eval. Advised lying in hooklying and trying ice and/or heat.

## 2020-01-07 ENCOUNTER — Telehealth: Payer: Self-pay | Admitting: Orthopaedic Surgery

## 2020-01-07 NOTE — Telephone Encounter (Signed)
I called and left her message. Forearm xrays done in ER , elbow not broken, bruising near elbow because she has followed instructions and kept wrist elevated.

## 2020-01-07 NOTE — Telephone Encounter (Signed)
Could you please advise on elbow? X-rays were made of forearm in the ED at time of injury. Would you like for patient to make appointment for elbow?

## 2020-01-07 NOTE — Telephone Encounter (Signed)
Pt called in said she had an xray done on her right wrist and was told it was broken but pt states she has been having constant pain in her right elbow as well and some bruising and she's wondering if the xray's she had done show her elbow as well because she believes it could be broken or fractured as well.    [phone]

## 2020-01-08 ENCOUNTER — Ambulatory Visit: Payer: No Typology Code available for payment source | Admitting: Physical Therapy

## 2020-01-12 ENCOUNTER — Encounter: Payer: Self-pay | Admitting: Orthopaedic Surgery

## 2020-01-12 ENCOUNTER — Encounter: Payer: Self-pay | Admitting: Physical Therapy

## 2020-01-13 ENCOUNTER — Ambulatory Visit (INDEPENDENT_AMBULATORY_CARE_PROVIDER_SITE_OTHER): Payer: No Typology Code available for payment source | Admitting: Orthopaedic Surgery

## 2020-01-13 ENCOUNTER — Encounter: Payer: Self-pay | Admitting: Orthopaedic Surgery

## 2020-01-13 ENCOUNTER — Ambulatory Visit (INDEPENDENT_AMBULATORY_CARE_PROVIDER_SITE_OTHER): Payer: No Typology Code available for payment source

## 2020-01-13 ENCOUNTER — Telehealth: Payer: Self-pay | Admitting: Orthopaedic Surgery

## 2020-01-13 ENCOUNTER — Other Ambulatory Visit: Payer: Self-pay

## 2020-01-13 ENCOUNTER — Telehealth: Payer: Self-pay | Admitting: Physical Therapy

## 2020-01-13 VITALS — Ht 67.0 in | Wt 121.0 lb

## 2020-01-13 DIAGNOSIS — S52601A Unspecified fracture of lower end of right ulna, initial encounter for closed fracture: Secondary | ICD-10-CM

## 2020-01-13 DIAGNOSIS — S52501A Unspecified fracture of the lower end of right radius, initial encounter for closed fracture: Secondary | ICD-10-CM

## 2020-01-13 NOTE — Progress Notes (Signed)
Post-Op Visit Note   Patient: Lacey Leon           Date of Birth: January 11, 1961           MRN: [id number] Visit Date: 01/13/2020 PCP: Leamon Arnt, MD   Assessment & Plan: 59 year old female returns early she called she was concerned she might have an infection.  Steri-Strips removed incision looks good there is no cellulitis.  She has 45 degrees pronation but only 10 degrees of supination.  She did have dorsal comminution.  The EPL is active.  She has been having significant problems with her opposite left arm left shoulder and this therapy set up possibly for some dry needling since she has had increased pain in her shoulder and neck having to do everything one-handed.  Patient can remove the splint several times a day she will wash her hand and will work on gentle supination and pronation passive stretching exercises which were instructed.  She will work on this with the therapist as well.  I plan to recheck her next week  Chief Complaint:  Chief Complaint  Patient presents with  . Right Wrist - Wound Check    12/10/2019 ORIF Right Distal Radius   Visit Diagnoses:  1. Closed fracture of distal ends of right radius and ulna, initial encounter     Plan: Short on wrist pronation supination keeping her elbow locked against her chest wall.  I will recheck her next week.  Patient's had considerable anxiety since she is having to take care of herself does not have any family or friends available.  Recheck 1 week.  Follow-Up Instructions: No follow-ups on file.   Orders:  Orders Placed This Encounter  Procedures  . XR Wrist Complete Right   No orders of the defined types were placed in this encounter.   Imaging: No results found.  PMFS History: Patient Active Problem List   Diagnosis Date Noted  . Closed fracture of right distal radius 12/10/2019  . Closed fracture of right distal radius and ulna 12/08/2019  . 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 Familial hyperlipidemia 06/23/2019    . Major depression, recurrent, chronic ([place]) 11/01/2018  . Chronic prescription benzodiazepine use 11/01/2018  . Situational anxiety 12/23/2015  . Generalized anxiety disorder 12/22/2015  . Ulcerative colitis ([place]) 12/22/2015   Past Medical History:  Diagnosis Date  . Anxiety   . Arrhythmia   . Arthritis   . Blood in stool   . Chicken pox   . Chronic prescription benzodiazepine use 11/01/2018  . Complication of anesthesia    unable to void for several days after anesthesia  . Familial hyperlipidemia 06/23/2019   Excellent HDL but LDL 197 06/2019; recommended statin  . GERD (gastroesophageal reflux disease)   . Major depression, recurrent, chronic ([place]) 11/01/2018   On zoloft since age 78  . Migraine   . Migraine headache 12/22/2015  . Mitral valve prolapse   . Pneumonia   . Reaction, adjustment, with depressed mood, prolonged 01/07/2016  . Ulcerative colitis ([place])     Family History  Problem Relation Age of Onset  . Depression Mother   . Heart disease Father     Past Surgical History:  Procedure Laterality Date  . BREAST BIOPSY  2010  . COLONOSCOPY    . OPEN REDUCTION INTERNAL FIXATION (ORIF) DISTAL RADIAL FRACTURE Right 12/10/2019   Procedure: OPEN REDUCTION INTERNAL FIXATION (ORIF) DISTAL RADIAL FRACTURE RIGHT WRIST;  Surgeon: Marybelle Killings, MD;  Location: [facility];  Service:  Orthopedics;  Laterality: Right;  . WISDOM TOOTH EXTRACTION     Social History   Occupational History  . Not on file  Tobacco Use  . Smoking status: Never Smoker  . Smokeless tobacco: Never Used  Substance and Sexual Activity  . Alcohol use: Never  . Drug use: Never  . Sexual activity: Never

## 2020-01-13 NOTE — Telephone Encounter (Signed)
Patient called.   She is requesting a call back to discuss how she can detect an infection.   Call back: [phone]

## 2020-01-13 NOTE — Telephone Encounter (Signed)
Returned patient's phone call regarding her visit tomorrow. Patient was upset from not hearing from me until today. I explained that I was on vacation until today and we discussed my plans for tomorrow's visit. She has been experiencing migraines since her initial eval and in the past day or two has been experiencing unsteadiness with walking which she attributed to spasms. Patient has a call into Dr. Lorin Mercy as well and will ask about DN when the nurse returns her call.

## 2020-01-13 NOTE — Telephone Encounter (Signed)
I called patient. She states that her pain has gotten worse. It is not only her arm but is her whole body. She has not taken off the splint. She thinks that the arm looks red under the splint. I offered a work in appt for tomorrow morning, but patient has meetings. She states that she could be here in the next 15 minutes or so. I am making work in appointment for this afternoon for wound check.

## 2020-01-14 ENCOUNTER — Ambulatory Visit: Payer: No Typology Code available for payment source | Attending: Orthopaedic Surgery | Admitting: Physical Therapy

## 2020-01-14 ENCOUNTER — Encounter: Payer: Self-pay | Admitting: Physical Therapy

## 2020-01-14 DIAGNOSIS — M79631 Pain in right forearm: Secondary | ICD-10-CM | POA: Diagnosis present

## 2020-01-14 DIAGNOSIS — M25631 Stiffness of right wrist, not elsewhere classified: Secondary | ICD-10-CM | POA: Insufficient documentation

## 2020-01-14 DIAGNOSIS — R293 Abnormal posture: Secondary | ICD-10-CM | POA: Diagnosis present

## 2020-01-14 DIAGNOSIS — M25512 Pain in left shoulder: Secondary | ICD-10-CM | POA: Insufficient documentation

## 2020-01-14 DIAGNOSIS — M6281 Muscle weakness (generalized): Secondary | ICD-10-CM | POA: Insufficient documentation

## 2020-01-14 DIAGNOSIS — M542 Cervicalgia: Secondary | ICD-10-CM | POA: Insufficient documentation

## 2020-01-14 DIAGNOSIS — M25511 Pain in right shoulder: Secondary | ICD-10-CM | POA: Insufficient documentation
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
## 2020-01-15 ENCOUNTER — Encounter: Payer: Self-pay | Admitting: Physical Therapy

## 2020-01-15 ENCOUNTER — Other Ambulatory Visit: Payer: Self-pay

## 2020-01-15 ENCOUNTER — Ambulatory Visit: Payer: No Typology Code available for payment source | Admitting: Physical Therapy

## 2020-01-15 DIAGNOSIS — M25631 Stiffness of right wrist, not elsewhere classified: Secondary | ICD-10-CM

## 2020-01-15 DIAGNOSIS — R293 Abnormal posture: Secondary | ICD-10-CM

## 2020-01-15 DIAGNOSIS — M542 Cervicalgia: Secondary | ICD-10-CM | POA: Diagnosis not present

## 2020-01-15 DIAGNOSIS — M25512 Pain in left shoulder: Secondary | ICD-10-CM

## 2020-01-15 DIAGNOSIS — M25511 Pain in right shoulder: Secondary | ICD-10-CM

## 2020-01-15 DIAGNOSIS — M6281 Muscle weakness (generalized): Secondary | ICD-10-CM

## 2020-01-15 DIAGNOSIS — M79631 Pain in right forearm: Secondary | ICD-10-CM
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
## 2020-01-20 ENCOUNTER — Telehealth: Payer: Self-pay | Admitting: Physical Therapy

## 2020-01-20 ENCOUNTER — Other Ambulatory Visit: Payer: Self-pay

## 2020-01-20 ENCOUNTER — Encounter: Payer: Self-pay | Admitting: Orthopaedic Surgery

## 2020-01-20 ENCOUNTER — Ambulatory Visit (INDEPENDENT_AMBULATORY_CARE_PROVIDER_SITE_OTHER): Payer: No Typology Code available for payment source | Admitting: Orthopaedic Surgery

## 2020-01-20 VITALS — BP 114/74 | HR 74 | Ht 67.0 in | Wt 121.0 lb

## 2020-01-20 DIAGNOSIS — S52591A Other fractures of lower end of right radius, initial encounter for closed fracture: Secondary | ICD-10-CM

## 2020-01-20 NOTE — Progress Notes (Signed)
Post-Op Visit Note   Patient: Lacey Leon           Date of Birth: December 06, 1960           MRN: [id number] Visit Date: 01/20/2020 PCP: Leamon Arnt, MD   Assessment & Plan: Post ORIF right distal radius.  She is a great difficulty with motion.  She can wean herself out of the splint and needs to work on elbow flexion extension.  She has been holding her hand with her elbow flexed at 90 degrees in neutral position avoiding any supination pronation.  We spent some time and picked up 30 degrees of supination pronation today she still has more trouble with pronation and has comminution on the dorsal aspect of the distal radius.  Chief Complaint:  Chief Complaint  Patient presents with  . Right Wrist - Follow-up    12/10/2019 ORIF Right Wrist   Visit Diagnoses:  1. Other closed fracture of distal end of right radius, initial encounter     Plan: Patient needs more work on supination pronation wrist flexion extension and also on finger flexion we went over multiple exercises she was doing on her own and I sent a message to her therapist.  I will check her back again in 2 weeks.  Repeat AP lateral x-rays of the wrist on return.  Follow-Up Instructions: Return in about 2 weeks (around 02/03/2020).  Orders:  No orders of the defined types were placed in this encounter.  No orders of the defined types were placed in this encounter.   Imaging: No results found.  PMFS History: Patient Active Problem List   Diagnosis Date Noted  . Closed fracture of right distal radius 12/10/2019  . Closed fracture of right distal radius and ulna 12/08/2019  . 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 Familial hyperlipidemia 06/23/2019  . Major depression, recurrent, chronic ([place]) 11/01/2018  . Chronic prescription benzodiazepine use 11/01/2018  . Situational anxiety 12/23/2015  . Generalized anxiety disorder 12/22/2015  . Ulcerative colitis ([place]) 12/22/2015   Past Medical History:  Diagnosis Date  . Anxiety    . Arrhythmia   . Arthritis   . Blood in stool   . Chicken pox   . Chronic prescription benzodiazepine use 11/01/2018  . Complication of anesthesia    unable to void for several days after anesthesia  . Familial hyperlipidemia 06/23/2019   Excellent HDL but LDL 197 06/2019; recommended statin  . GERD (gastroesophageal reflux disease)   . Major depression, recurrent, chronic ([place]) 11/01/2018   On zoloft since age 67  . Migraine   . Migraine headache 12/22/2015  . Mitral valve prolapse   . Pneumonia   . Reaction, adjustment, with depressed mood, prolonged 01/07/2016  . Ulcerative colitis ([place])     Family History  Problem Relation Age of Onset  . Depression Mother   . Heart disease Father     Past Surgical History:  Procedure Laterality Date  . BREAST BIOPSY  2010  . COLONOSCOPY    . OPEN REDUCTION INTERNAL FIXATION (ORIF) DISTAL RADIAL FRACTURE Right 12/10/2019   Procedure: OPEN REDUCTION INTERNAL FIXATION (ORIF) DISTAL RADIAL FRACTURE RIGHT WRIST;  Surgeon: Marybelle Killings, MD;  Location: [facility];  Service: Orthopedics;  Laterality: Right;  . WISDOM TOOTH EXTRACTION     Social History   Occupational History  . Not on file  Tobacco Use  . Smoking status: Never Smoker  . Smokeless tobacco: Never Used  Substance and Sexual Activity  . Alcohol use:  Never  . Drug use: Never  . Sexual activity: Never

## 2020-01-20 NOTE — Telephone Encounter (Signed)
Returned pt phone call regarding protocol for wrist and informed her that Dr. Lorin Mercy did not have one and we would work on supination as he requested.

## 2020-01-21 ENCOUNTER — Encounter: Payer: No Typology Code available for payment source | Admitting: Physical Therapy

## 2020-01-22 ENCOUNTER — Encounter: Payer: Self-pay | Admitting: Physical Therapy

## 2020-01-22 ENCOUNTER — Other Ambulatory Visit: Payer: Self-pay

## 2020-01-22 ENCOUNTER — Ambulatory Visit: Payer: No Typology Code available for payment source | Admitting: Physical Therapy

## 2020-01-22 DIAGNOSIS — M79631 Pain in right forearm: Secondary | ICD-10-CM

## 2020-01-22 DIAGNOSIS — M25631 Stiffness of right wrist, not elsewhere classified: Secondary | ICD-10-CM

## 2020-01-22 DIAGNOSIS — M542 Cervicalgia: Secondary | ICD-10-CM | POA: Diagnosis not present
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
## 2020-01-28 ENCOUNTER — Encounter: Payer: Self-pay | Admitting: Family Medicine

## 2020-01-28 ENCOUNTER — Ambulatory Visit (INDEPENDENT_AMBULATORY_CARE_PROVIDER_SITE_OTHER): Payer: No Typology Code available for payment source | Admitting: Family Medicine

## 2020-01-28 ENCOUNTER — Other Ambulatory Visit: Payer: Self-pay

## 2020-01-28 VITALS — BP 108/68 | HR 85 | Temp 97.2°F | Resp 16 | Ht 67.0 in | Wt 128.8 lb

## 2020-01-28 DIAGNOSIS — F339 Major depressive disorder, recurrent, unspecified: Secondary | ICD-10-CM | POA: Diagnosis not present

## 2020-01-28 DIAGNOSIS — Z79899 Other long term (current) drug therapy: Secondary | ICD-10-CM

## 2020-01-28 DIAGNOSIS — K5190 Ulcerative colitis, unspecified, without complications: Secondary | ICD-10-CM | POA: Diagnosis not present

## 2020-01-28 DIAGNOSIS — E559 Vitamin D deficiency, unspecified: Secondary | ICD-10-CM

## 2020-01-28 DIAGNOSIS — F411 Generalized anxiety disorder: Secondary | ICD-10-CM

## 2020-01-28 DIAGNOSIS — E7849 Other hyperlipidemia: Secondary | ICD-10-CM | POA: Diagnosis not present

## 2020-01-28 LAB — BASIC METABOLIC PANEL
BUN: 17 mg/dL (ref 6–23)
CO2: 29 mEq/L (ref 19–32)
Calcium: 9.3 mg/dL (ref 8.4–10.5)
Chloride: 98 mEq/L (ref 96–112)
Creatinine, Ser: 0.7 mg/dL (ref 0.40–1.20)
GFR: 86.38 mL/min (ref >60.00)
Glucose, Bld: 77 mg/dL (ref 70–99)
Potassium: 3.9 mEq/L (ref 3.5–5.1)
Sodium: 133 mEq/L — ABNORMAL LOW (ref 135–145)

## 2020-01-28 LAB — LIPID PANEL
Cholesterol: 279 mg/dL — ABNORMAL HIGH (ref 0–200)
HDL: 99.9 mg/dL (ref >39.00)
LDL Cholesterol: 169 mg/dL — ABNORMAL HIGH (ref 0–99)
NonHDL: 178.69
Total CHOL/HDL Ratio: 3
Triglycerides: 48 mg/dL (ref 0.0–149.0)
VLDL: 9.6 mg/dL (ref 0.0–40.0)

## 2020-01-28 LAB — VITAMIN D 25 HYDROXY (VIT D DEFICIENCY, FRACTURES): VITD: 45.45 ng/mL (ref 30.00–100.00)

## 2020-01-28 NOTE — Patient Instructions (Signed)
Please return in September 2021 for your annual complete physical; please come fasting.    If you have any questions or concerns, please don't hesitate to send me a message via MyChart or call the office at [phone]. Thank you for visiting with [place] today! It's our pleasure caring for you.  Here are your labs from September for your review:  Lab Results  Component Value Date   CHOL 326 (H) 06/20/2019   Lab Results  Component Value Date   HDL 119.00 06/20/2019   Lab Results  Component Value Date   LDLCALC 197 (H) 06/20/2019   Lab Results  Component Value Date   TRIG 48.0 06/20/2019   Lab Results  Component Value Date   CHOLHDL 3 06/20/2019   No results found for: LDLDIRECT  Last vitamin D Lab Results  Component Value Date   VD25OH 49.48 06/20/2019   Lab Results  Component Value Date   CREATININE 0.69 12/10/2019   BUN 14 12/10/2019   NA 136 12/10/2019   K 4.0 12/10/2019   CL 102 12/10/2019   CO2 24 12/10/2019

## 2020-01-28 NOTE — Progress Notes (Signed)
Subjective  CC:  Chief Complaint  Patient presents with  . Anxiety    states well controlled, broke right wrist - recently had surgery  . Leg Swelling    swelling is well controlled, but has been experiencing cramps, 10,000 units Vit D daily    HPI: Lacey Leon is a 59 y.o. female who presents to the office today to address the problems listed above in the chief complaint.  Here for 6 month f/u. Struggling with right wrist fracture but otherwise doing well. Anxiety is well controlled.   HLD: would like this rechecked. Reports eats well. Still very fearful of statins; declines them but wants lipids checked.   UC is stable  Has questions about her renal function which is normal.    Assessment  1. Generalized anxiety disorder   2. Ulcerative colitis without complications, unspecified location ([place])   3. Chronic prescription benzodiazepine use   4. Major depression, recurrent, chronic ([facility]) Chronic     Plan   Gad/depression:  Now well controlled  Recheck lipids. Discussed likelihood of familial HLD.   Recheck vit D levels.   Continue current meds.   Reassured, renal function and GFR are normal.   Follow up: sept 2021 for cpe  Visit date not found  No orders of the defined types were placed in this encounter.  No orders of the defined types were placed in this encounter.     I reviewed the patients updated PMH, FH, and SocHx.    Patient Active Problem List   Diagnosis Date Noted  . Familial hyperlipidemia 06/23/2019    Priority: High  . Major depression, recurrent, chronic ([place]) 11/01/2018    Priority: High  . Chronic prescription benzodiazepine use 11/01/2018    Priority: High  . Generalized anxiety disorder 12/22/2015    Priority: High  . Ulcerative colitis ([place]) 12/22/2015    Priority: High  . 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 Closed fracture of right distal radius 12/10/2019  . Closed fracture of right distal radius and ulna  12/08/2019  . Situational anxiety 12/23/2015   Current Meds  Medication Sig  . ergocalciferol (DRISDOL) 200 MCG/ML drops Take 8,000 Units by mouth daily.  . fluticasone (FLONASE) 50 MCG/ACT nasal spray Place 1 spray into both nostrils daily as needed for allergies or rhinitis.  Marland Kitchen LORazepam (ATIVAN) 1 MG tablet Take 1 tablet (1 mg total) by mouth 2 (two) times daily as needed for anxiety. (Patient taking differently: Take 1 mg by mouth at bedtime. )  . naproxen sodium (ANAPROX) 550 MG tablet Take 550 mg by mouth 2 (two) times daily as needed for headache.  . sertraline (ZOLOFT) 100 MG tablet Take 100 mg by mouth at bedtime.     Allergies: Patient is allergic to ciprofloxacin; clarithromycin; and other. Family History: Patient family history includes Depression in her mother; Heart disease in her father. Social History:  Patient  reports that she has never smoked. She has never used smokeless tobacco. She reports that she does not drink alcohol or use drugs.  Review of Systems: Constitutional: Negative for fever malaise or anorexia Cardiovascular: negative for chest pain Respiratory: negative for SOB or persistent cough Gastrointestinal: negative for abdominal pain  Objective  Vitals: BP 108/68   Pulse 85   Temp (!) 97.2 F (36.2 C) (Temporal)   Resp 16   Ht 5' 7"  (1.702 m)   Wt 128 lb 12.8 oz (58.4 kg)   SpO2 98%   BMI  20.17 kg/m  General: no acute distress , A&Ox3 HEENT: PEERL, conjunctiva normal, neck is supple Cardiovascular:  RRR without murmur or gallop.  Respiratory:  Good breath sounds bilaterally, CTAB with normal respiratory effort Skin:  Warm, no rashes Right arm in wrist splint. Well healing surgical scar present    Commons side effects, risks, benefits, and alternatives for medications and treatment plan prescribed today were discussed, and the patient expressed understanding of the given instructions. Patient is instructed to call or message via MyChart if  he/she has any questions or concerns regarding our treatment plan. No barriers to understanding were identified. We discussed Red Flag symptoms and signs in detail. Patient expressed understanding regarding what to do in case of urgent or emergency type symptoms.   Medication list was reconciled, printed and provided to the patient in AVS. Patient instructions and summary information was reviewed with the patient as documented in the AVS. This note was prepared with assistance of Dragon voice recognition software. Occasional wrong-word or sound-a-like substitutions may have occurred due to the inherent limitations of voice recognition software  This visit occurred during the SARS-CoV-2 public health emergency.  Safety protocols were in place, including screening questions prior to the visit, additional usage of staff PPE, and extensive cleaning of exam room while observing appropriate contact time as indicated for disinfecting solutions.

## 2020-01-29 ENCOUNTER — Ambulatory Visit: Payer: No Typology Code available for payment source | Admitting: Physical Therapy

## 2020-01-29 ENCOUNTER — Encounter: Payer: Self-pay | Admitting: Physical Therapy

## 2020-01-29 DIAGNOSIS — M25631 Stiffness of right wrist, not elsewhere classified: Secondary | ICD-10-CM

## 2020-01-29 DIAGNOSIS — M79631 Pain in right forearm: Secondary | ICD-10-CM

## 2020-01-29 DIAGNOSIS — M542 Cervicalgia: Secondary | ICD-10-CM | POA: Diagnosis not present

## 2020-01-29 NOTE — Patient Instructions (Signed)
Access Code: [email or web address] URL: [email or web address] Date: 01/29/2020 Prepared by: Madelyn Flavors  Exercises Thoracic Extension Mobilization on Foam Roll - 2 x daily - 7 x weekly - 2 sets - 5 reps - you may hold the position for a stretch hold Seated Thoracic Extension with Hands Behind Neck - 3 x daily - 7 x weekly - 1 sets - 3 reps Seated Levator Scapulae Stretch - 2 x daily - 7 x weekly - 3 reps - 30 hold Seated Upper Trapezius Stretch - 2 x daily - 7 x weekly - 3 reps - 30 hold Sternocleidomastoid Stretch - 2 x daily - 7 x weekly - 3 reps - 30 hold Standing Isometric Cervical Sidebending with Manual Resistance - 1 x daily - 7 x weekly - 5 reps - 1 sets - 10 sec hold hold Standing Isometric Cervical Flexion with Manual Resistance - 1 x daily - 7 x weekly - 5 reps - 1 sets - 10 sec hold Standing Isometric Cervical Extension with Manual Resistance - 1 x daily - 7 x weekly - 5 reps - 1 sets - 10 sec hold Seated Isometric Cervical Rotation - 1 x daily - 7 x weekly - 5 reps - 1 sets - 10 sec hold Seated Wrist Supination Stretch - 3 x daily - 7 x weekly - 1 sets - 10 reps - prolonged hold Forearm Supination PROM - 3 x daily - 7 x weekly - 1 sets - 10 reps Seated Wrist Flexion with Overpressure - 3 x daily - 7 x weekly - 1 sets - 3 reps - 30-60 sec hold Seated Wrist Extension PROM - 3 x daily - 7 x weekly - 1 sets - 3 reps - 30-60 hold Seated Finger MP Extension PROM - 3 x daily - 7 x weekly - 1 sets - 5 reps - 5 sec hold
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
## 2020-02-02 ENCOUNTER — Ambulatory Visit: Payer: No Typology Code available for payment source | Admitting: Physical Therapy

## 2020-02-02 ENCOUNTER — Other Ambulatory Visit: Payer: Self-pay

## 2020-02-02 ENCOUNTER — Encounter: Payer: Self-pay | Admitting: Physical Therapy

## 2020-02-02 DIAGNOSIS — M542 Cervicalgia: Secondary | ICD-10-CM

## 2020-02-02 DIAGNOSIS — M25631 Stiffness of right wrist, not elsewhere classified: Secondary | ICD-10-CM

## 2020-02-02 DIAGNOSIS — M79631 Pain in right forearm: Secondary | ICD-10-CM

## 2020-02-02 DIAGNOSIS — M25511 Pain in right shoulder: Secondary | ICD-10-CM

## 2020-02-02 DIAGNOSIS — M6281 Muscle weakness (generalized): Secondary | ICD-10-CM

## 2020-02-02 DIAGNOSIS — R293 Abnormal posture: Secondary | ICD-10-CM

## 2020-02-02 DIAGNOSIS — M25512 Pain in left shoulder: Secondary | ICD-10-CM
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
## 2020-02-03 ENCOUNTER — Telehealth: Payer: Self-pay | Admitting: Orthopaedic Surgery

## 2020-02-03 ENCOUNTER — Other Ambulatory Visit: Payer: Self-pay

## 2020-02-03 ENCOUNTER — Ambulatory Visit (INDEPENDENT_AMBULATORY_CARE_PROVIDER_SITE_OTHER): Payer: No Typology Code available for payment source | Admitting: Orthopaedic Surgery

## 2020-02-03 ENCOUNTER — Encounter: Payer: Self-pay | Admitting: Orthopaedic Surgery

## 2020-02-03 ENCOUNTER — Ambulatory Visit (INDEPENDENT_AMBULATORY_CARE_PROVIDER_SITE_OTHER): Payer: No Typology Code available for payment source

## 2020-02-03 VITALS — BP 103/64 | HR 87 | Ht 67.0 in | Wt 128.0 lb

## 2020-02-03 DIAGNOSIS — S52591A Other fractures of lower end of right radius, initial encounter for closed fracture: Secondary | ICD-10-CM | POA: Diagnosis not present

## 2020-02-03 MED ORDER — DICLOFENAC SODIUM 1 % EX GEL: 4.0000 g | Freq: Four times a day (QID) | CUTANEOUS | Status: DC

## 2020-02-03 MED ORDER — DICLOFENAC SODIUM 1 % EX GEL: 4.0000 g | Freq: Four times a day (QID) | CUTANEOUS | 3 refills | Status: DC

## 2020-02-03 MED FILL — DICLOFENAC SODIUM 1 % GEL: 1 | 6 days supply | Qty: 100 | Fill #0

## 2020-02-03 NOTE — Telephone Encounter (Signed)
4 tubes thanks

## 2020-02-03 NOTE — Progress Notes (Signed)
   Post-Op Visit Note   Patient: Lacey Leon           Date of Birth: 1960/11/22           MRN: [id number] Visit Date: 02/03/2020 PCP: Leamon Arnt, MD   Assessment & Plan: Post-ORIF distal radius.  She is doing better with pronation still has maybe 10 degrees supination we spent some time working on some passive stretching and picked up about 30 degrees of supination.  She will work on this on her own at home.  She is getting some radial ulnar deviation and is almost 50% flexion extension of the wrist.  Should continue to work on this I will recheck her again in 3 weeks.  Chief Complaint:  Chief Complaint  Patient presents with  . Right Wrist - Follow-up    12/10/2019 ORIF Right Wrist   Visit Diagnoses:  1. Other closed fracture of distal end of right radius, initial encounter     Plan: Continue work on range of motion she continues most have pain over the ulnar styloid fracture area.  She will focus mostly on supination which is her biggest deficit and I will recheck her again in 3 weeks.  Follow-Up Instructions: No follow-ups on file.   Orders:  Orders Placed This Encounter  Procedures  . XR Wrist 2 Views Right   No orders of the defined types were placed in this encounter.   Imaging: No results found.  PMFS History: Patient Active Problem List   Diagnosis Date Noted  . Closed fracture of right distal radius 12/10/2019  . Closed fracture of right distal radius and ulna 12/08/2019  . 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 Familial hyperlipidemia 06/23/2019  . Major depression, recurrent, chronic ([place]) 11/01/2018  . Chronic prescription benzodiazepine use 11/01/2018  . Situational anxiety 12/23/2015  . Generalized anxiety disorder 12/22/2015  . Ulcerative colitis ([place]) 12/22/2015   Past Medical History:  Diagnosis Date  . Anxiety   . Arrhythmia   . Arthritis   . Blood in stool   . Chicken pox   . Chronic prescription benzodiazepine use 11/01/2018  . Complication of  anesthesia    unable to void for several days after anesthesia  . Familial hyperlipidemia 06/23/2019   Excellent HDL but LDL 197 06/2019; recommended statin  . GERD (gastroesophageal reflux disease)   . Major depression, recurrent, chronic ([place]) 11/01/2018   On zoloft since age 89  . Migraine   . Migraine headache 12/22/2015  . Mitral valve prolapse   . Pneumonia   . Reaction, adjustment, with depressed mood, prolonged 01/07/2016  . Ulcerative colitis ([place])     Family History  Problem Relation Age of Onset  . Depression Mother   . Heart disease Father     Past Surgical History:  Procedure Laterality Date  . BREAST BIOPSY  2010  . COLONOSCOPY    . OPEN REDUCTION INTERNAL FIXATION (ORIF) DISTAL RADIAL FRACTURE Right 12/10/2019   Procedure: OPEN REDUCTION INTERNAL FIXATION (ORIF) DISTAL RADIAL FRACTURE RIGHT WRIST;  Surgeon: Marybelle Killings, MD;  Location: [place];  Service: Orthopedics;  Laterality: Right;  . WISDOM TOOTH EXTRACTION     Social History   Occupational History  . Not on file  Tobacco Use  . Smoking status: Never Smoker  . Smokeless tobacco: Never Used  Substance and Sexual Activity  . Alcohol use: Never  . Drug use: Never  . Sexual activity: Never

## 2020-02-03 NOTE — Addendum Note (Signed)
Addended by: Marybelle Killings on: 02/03/2020 02:52 PM   Modules accepted: Orders

## 2020-02-03 NOTE — Addendum Note (Signed)
Addended by: Meyer Cory on: 02/03/2020 05:17 PM   Modules accepted: Orders

## 2020-02-03 NOTE — Telephone Encounter (Signed)
I left voicemail for pharmacy. Also sent in new rx.

## 2020-02-03 NOTE — Telephone Encounter (Signed)
Please advise 

## 2020-02-03 NOTE — Telephone Encounter (Signed)
Pharmacy called.   Prescription that was called in was missing the [place]   Call back: [phone]

## 2020-02-09 ENCOUNTER — Ambulatory Visit: Payer: No Typology Code available for payment source | Attending: Orthopaedic Surgery | Admitting: Physical Therapy

## 2020-02-09 ENCOUNTER — Encounter: Payer: Self-pay | Admitting: Physical Therapy

## 2020-02-09 ENCOUNTER — Other Ambulatory Visit: Payer: Self-pay

## 2020-02-09 DIAGNOSIS — R293 Abnormal posture: Secondary | ICD-10-CM | POA: Diagnosis present

## 2020-02-09 DIAGNOSIS — M25631 Stiffness of right wrist, not elsewhere classified: Secondary | ICD-10-CM | POA: Insufficient documentation

## 2020-02-09 DIAGNOSIS — M79631 Pain in right forearm: Secondary | ICD-10-CM | POA: Diagnosis present

## 2020-02-09 DIAGNOSIS — M25512 Pain in left shoulder: Secondary | ICD-10-CM | POA: Diagnosis present

## 2020-02-09 DIAGNOSIS — M542 Cervicalgia: Secondary | ICD-10-CM | POA: Insufficient documentation

## 2020-02-09 DIAGNOSIS — M25511 Pain in right shoulder: Secondary | ICD-10-CM | POA: Diagnosis present

## 2020-02-09 DIAGNOSIS — M6281 Muscle weakness (generalized): Secondary | ICD-10-CM | POA: Insufficient documentation
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
## 2020-02-10 ENCOUNTER — Encounter: Payer: No Typology Code available for payment source | Admitting: Physical Therapy

## 2020-02-11 ENCOUNTER — Encounter: Payer: No Typology Code available for payment source | Admitting: Physical Therapy

## 2020-02-12 ENCOUNTER — Ambulatory Visit: Payer: No Typology Code available for payment source | Admitting: Physical Therapy

## 2020-02-12 ENCOUNTER — Other Ambulatory Visit: Payer: Self-pay

## 2020-02-12 ENCOUNTER — Encounter: Payer: Self-pay | Admitting: Physical Therapy

## 2020-02-12 DIAGNOSIS — M542 Cervicalgia: Secondary | ICD-10-CM

## 2020-02-12 DIAGNOSIS — M79631 Pain in right forearm: Secondary | ICD-10-CM

## 2020-02-12 DIAGNOSIS — M25631 Stiffness of right wrist, not elsewhere classified: Secondary | ICD-10-CM | POA: Diagnosis not present

## 2020-02-12 DIAGNOSIS — M6281 Muscle weakness (generalized): Secondary | ICD-10-CM
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
## 2020-02-16 ENCOUNTER — Ambulatory Visit: Payer: No Typology Code available for payment source | Admitting: Physical Therapy

## 2020-02-16 ENCOUNTER — Encounter: Payer: Self-pay | Admitting: Physical Therapy

## 2020-02-16 ENCOUNTER — Other Ambulatory Visit: Payer: Self-pay

## 2020-02-16 DIAGNOSIS — M79631 Pain in right forearm: Secondary | ICD-10-CM

## 2020-02-16 DIAGNOSIS — M25631 Stiffness of right wrist, not elsewhere classified: Secondary | ICD-10-CM
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
## 2020-02-17 ENCOUNTER — Encounter: Payer: No Typology Code available for payment source | Admitting: Physical Therapy

## 2020-02-18 ENCOUNTER — Other Ambulatory Visit: Payer: Self-pay

## 2020-02-18 ENCOUNTER — Telehealth: Payer: Self-pay | Admitting: Family Medicine

## 2020-02-18 DIAGNOSIS — S52599A Other fractures of lower end of unspecified radius, initial encounter for closed fracture: Secondary | ICD-10-CM

## 2020-02-18 NOTE — Telephone Encounter (Signed)
Referral sent 

## 2020-02-18 NOTE — Telephone Encounter (Signed)
Ok to place referral. DX: distal radius fracture s/p ORIF

## 2020-02-18 NOTE — Telephone Encounter (Signed)
Patient is asking if Dr.Andy can put in a referral for a hand/arm specialist over at [facility] preferably Dr.Greymig, states the swelling in elbow has not gone down and is still bothering her since she has had surgery on it back in March. Patient has also stated she does not want to see Dr.Yates any longer.  [phone] --[facility]

## 2020-02-20 ENCOUNTER — Encounter: Payer: Self-pay | Admitting: Physical Therapy

## 2020-02-20 ENCOUNTER — Other Ambulatory Visit: Payer: Self-pay

## 2020-02-20 ENCOUNTER — Ambulatory Visit: Payer: No Typology Code available for payment source | Admitting: Physical Therapy

## 2020-02-20 DIAGNOSIS — M25511 Pain in right shoulder: Secondary | ICD-10-CM

## 2020-02-20 DIAGNOSIS — M6281 Muscle weakness (generalized): Secondary | ICD-10-CM

## 2020-02-20 DIAGNOSIS — M79631 Pain in right forearm: Secondary | ICD-10-CM

## 2020-02-20 DIAGNOSIS — M25512 Pain in left shoulder: Secondary | ICD-10-CM

## 2020-02-20 DIAGNOSIS — M542 Cervicalgia: Secondary | ICD-10-CM

## 2020-02-20 DIAGNOSIS — M25631 Stiffness of right wrist, not elsewhere classified: Secondary | ICD-10-CM

## 2020-02-20 DIAGNOSIS — R293 Abnormal posture: Secondary | ICD-10-CM
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
## 2020-02-23 ENCOUNTER — Encounter: Payer: Self-pay | Admitting: Physical Therapy

## 2020-02-23 ENCOUNTER — Ambulatory Visit: Payer: No Typology Code available for payment source | Admitting: Physical Therapy

## 2020-02-23 ENCOUNTER — Other Ambulatory Visit: Payer: Self-pay

## 2020-02-23 DIAGNOSIS — M542 Cervicalgia: Secondary | ICD-10-CM

## 2020-02-23 DIAGNOSIS — M79631 Pain in right forearm: Secondary | ICD-10-CM

## 2020-02-23 DIAGNOSIS — M25511 Pain in right shoulder: Secondary | ICD-10-CM

## 2020-02-23 DIAGNOSIS — M25512 Pain in left shoulder: Secondary | ICD-10-CM

## 2020-02-23 DIAGNOSIS — R293 Abnormal posture: Secondary | ICD-10-CM

## 2020-02-23 DIAGNOSIS — M6281 Muscle weakness (generalized): Secondary | ICD-10-CM

## 2020-02-23 DIAGNOSIS — M25631 Stiffness of right wrist, not elsewhere classified: Secondary | ICD-10-CM
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
## 2020-02-24 ENCOUNTER — Ambulatory Visit (INDEPENDENT_AMBULATORY_CARE_PROVIDER_SITE_OTHER): Payer: No Typology Code available for payment source | Admitting: Orthopaedic Surgery

## 2020-02-24 ENCOUNTER — Encounter: Payer: Self-pay | Admitting: Orthopaedic Surgery

## 2020-02-24 VITALS — BP 116/74 | HR 97 | Ht 67.0 in | Wt 128.0 lb

## 2020-02-24 DIAGNOSIS — S52591S Other fractures of lower end of right radius, sequela: Secondary | ICD-10-CM

## 2020-02-24 NOTE — Progress Notes (Deleted)
   Office Visit Note   Patient: Lacey Leon           Date of Birth: [birth year]           MRN: [id number] Visit Date: 02/24/2020              Requested by: Leamon Arnt, [place],  [place] [id number] PCP: Leamon Arnt, MD   Assessment & Plan: Visit Diagnoses: No diagnosis found.  Plan: ***  Follow-Up Instructions: Return in about 2 months (around 04/25/2020).   Orders:  No orders of the defined types were placed in this encounter.  No orders of the defined types were placed in this encounter.     Procedures: No procedures performed   Clinical Data: No additional findings.   Subjective: Chief Complaint  Patient presents with  . Right Wrist - Follow-up    12/10/2019 ORIF Right Wrist    HPI  Review of Systems   Objective: Vital Signs: BP 116/74   Pulse 97   Ht 5' 7"  (1.702 m)   Wt 128 lb (58.1 kg)   BMI 20.05 kg/m   Physical Exam  Ortho Exam  Specialty Comments:  No specialty comments available.  Imaging: No results found.   PMFS History: Patient Active Problem List   Diagnosis Date Noted  . Closed fracture of right distal radius 12/10/2019  . Closed fracture of right distal radius and ulna 12/08/2019  . 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 Familial hyperlipidemia 06/23/2019  . Major depression, recurrent, chronic ([place]) 11/01/2018  . Chronic prescription benzodiazepine use 11/01/2018  . Situational anxiety 12/23/2015  . Generalized anxiety disorder 12/22/2015  . Ulcerative colitis ([place]) 12/22/2015   Past Medical History:  Diagnosis Date  . Anxiety   . Arrhythmia   . Arthritis   . Blood in stool   . Chicken pox   . Chronic prescription benzodiazepine use 11/01/2018  . Complication of anesthesia    unable to void for several days after anesthesia  . Familial hyperlipidemia 06/23/2019   Excellent HDL but LDL 197 06/2019; recommended statin  . GERD (gastroesophageal reflux disease)   . Major depression, recurrent,  chronic ([place]) 11/01/2018   On zoloft since age 59  . Migraine   . Migraine headache 12/22/2015  . Mitral valve prolapse   . Pneumonia   . Reaction, adjustment, with depressed mood, prolonged 01/07/2016  . Ulcerative colitis ([place])     Family History  Problem Relation Age of Onset  . Depression Mother   . Heart disease Father     Past Surgical History:  Procedure Laterality Date  . BREAST BIOPSY  2010  . COLONOSCOPY    . OPEN REDUCTION INTERNAL FIXATION (ORIF) DISTAL RADIAL FRACTURE Right 12/10/2019   Procedure: OPEN REDUCTION INTERNAL FIXATION (ORIF) DISTAL RADIAL FRACTURE RIGHT WRIST;  Surgeon: Marybelle Killings, MD;  Location: [place];  Service: Orthopedics;  Laterality: Right;  . WISDOM TOOTH EXTRACTION     Social History   Occupational History  . Not on file  Tobacco Use  . Smoking status: Never Smoker  . Smokeless tobacco: Never Used  Substance and Sexual Activity  . Alcohol use: Never  . Drug use: Never  . Sexual activity: Never

## 2020-02-24 NOTE — Progress Notes (Signed)
Post-Op Visit Note   Patient: Lacey Leon           Date of Birth: 1961-10-05           MRN: [id number] Visit Date: 02/24/2020 PCP: Leamon Arnt, MD   Assessment & Plan: Follow-up distal radius ORIF.  She has made slow gradual improvement in range of motion complicated by problems with anxiety.  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  She has picked up some supination still has problems with extension but is flexing better and has good excursion of her fingers and thumb.  I will check her back again in 2 months she still working and states she wants to improve enough so that she can resume playing tennis.  She states even if she could play tennis she would like to leave to be able to play badminton again.  Chief Complaint:  Chief Complaint  Patient presents with  . Right Wrist - Follow-up    12/10/2019 ORIF Right Wrist   Visit Diagnoses:  1. Other closed fracture of distal end of right radius, sequela     Plan: Continue active and passive stretching and continue use of her hand.  Once again reviewed her elbow x-rays which include in the forearm which were negative for fracture.  Lateral ligaments of the elbow are stable no elbow effusion and good range of motion of the elbow.  Follow-Up Instructions: Return in about 2 months (around 04/25/2020).   Orders:  No orders of the defined types were placed in this encounter.  No orders of the defined types were placed in this encounter.   Imaging: No results found.  PMFS History: Patient Active Problem List   Diagnosis Date Noted  . Closed fracture of right distal radius 12/10/2019  . Closed fracture of right distal radius and ulna 12/08/2019  . 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 Familial hyperlipidemia 06/23/2019  . Major depression, recurrent, chronic ([place]) 11/01/2018  . Chronic prescription benzodiazepine use 11/01/2018  . Situational anxiety 12/23/2015  . Generalized anxiety disorder  12/22/2015  . Ulcerative colitis ([place]) 12/22/2015   Past Medical History:  Diagnosis Date  . Anxiety   . Arrhythmia   . Arthritis   . Blood in stool   . Chicken pox   . Chronic prescription benzodiazepine use 11/01/2018  . Complication of anesthesia    unable to void for several days after anesthesia  . Familial hyperlipidemia 06/23/2019   Excellent HDL but LDL 197 06/2019; recommended statin  . GERD (gastroesophageal reflux disease)   . Major depression, recurrent, chronic ([place]) 11/01/2018   On zoloft since age 44  . Migraine   . Migraine headache 12/22/2015  . Mitral valve prolapse   . Pneumonia   . Reaction, adjustment, with depressed mood, prolonged 01/07/2016  . Ulcerative colitis ([place])     Family History  Problem Relation Age of Onset  . Depression Mother   . Heart disease Father     Past Surgical History:  Procedure Laterality Date  . BREAST BIOPSY  2010  . COLONOSCOPY    . OPEN REDUCTION INTERNAL FIXATION (ORIF) DISTAL RADIAL FRACTURE Right 12/10/2019   Procedure: OPEN REDUCTION INTERNAL FIXATION (ORIF) DISTAL RADIAL FRACTURE RIGHT WRIST;  Surgeon: Marybelle Killings, MD;  Location: [place];  Service: Orthopedics;  Laterality: Right;  . WISDOM TOOTH EXTRACTION     Social History   Occupational History  . Not on file  Tobacco Use  . Smoking  status: Never Smoker  . Smokeless tobacco: Never Used  Substance and Sexual Activity  . Alcohol use: Never  . Drug use: Never  . Sexual activity: Never

## 2020-02-25 ENCOUNTER — Ambulatory Visit: Payer: No Typology Code available for payment source | Admitting: Physical Therapy

## 2020-02-27 ENCOUNTER — Telehealth: Payer: Self-pay | Admitting: Orthopaedic Surgery

## 2020-02-27 DIAGNOSIS — S52591S Other fractures of lower end of right radius, sequela: Secondary | ICD-10-CM

## 2020-02-27 NOTE — Telephone Encounter (Signed)
Referral entered for [facility].

## 2020-02-27 NOTE — Telephone Encounter (Signed)
Patient called requesting a referral to [facility] hand specialist.  [phone].  Thank you.

## 2020-02-27 NOTE — Telephone Encounter (Signed)
Please advise. OK for referral?

## 2020-02-27 NOTE — Telephone Encounter (Signed)
I called . She wants to work with a Development worker, community. Send her to [facility] whatever their new name is thanks. Eval and Tx ROM . thanks

## 2020-03-01 ENCOUNTER — Other Ambulatory Visit: Payer: Self-pay

## 2020-03-01 ENCOUNTER — Ambulatory Visit: Payer: No Typology Code available for payment source | Admitting: Physical Therapy

## 2020-03-01 ENCOUNTER — Encounter: Payer: Self-pay | Admitting: Physical Therapy

## 2020-03-01 DIAGNOSIS — M25631 Stiffness of right wrist, not elsewhere classified: Secondary | ICD-10-CM

## 2020-03-01 DIAGNOSIS — M79631 Pain in right forearm: Secondary | ICD-10-CM

## 2020-03-01 DIAGNOSIS — M6281 Muscle weakness (generalized): Secondary | ICD-10-CM

## 2020-03-01 DIAGNOSIS — M542 Cervicalgia: Secondary | ICD-10-CM
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
## 2020-03-03 ENCOUNTER — Other Ambulatory Visit: Payer: Self-pay

## 2020-03-03 ENCOUNTER — Telehealth: Payer: Self-pay | Admitting: Orthopaedic Surgery

## 2020-03-03 ENCOUNTER — Encounter: Payer: Self-pay | Admitting: Physical Therapy

## 2020-03-03 ENCOUNTER — Ambulatory Visit: Payer: No Typology Code available for payment source | Admitting: Physical Therapy

## 2020-03-03 DIAGNOSIS — S52591S Other fractures of lower end of right radius, sequela: Secondary | ICD-10-CM

## 2020-03-03 DIAGNOSIS — R293 Abnormal posture: Secondary | ICD-10-CM

## 2020-03-03 DIAGNOSIS — M25512 Pain in left shoulder: Secondary | ICD-10-CM

## 2020-03-03 DIAGNOSIS — M542 Cervicalgia: Secondary | ICD-10-CM

## 2020-03-03 DIAGNOSIS — M79631 Pain in right forearm: Secondary | ICD-10-CM

## 2020-03-03 DIAGNOSIS — M25631 Stiffness of right wrist, not elsewhere classified: Secondary | ICD-10-CM | POA: Diagnosis not present

## 2020-03-03 DIAGNOSIS — M6281 Muscle weakness (generalized): Secondary | ICD-10-CM

## 2020-03-03 DIAGNOSIS — M25511 Pain in right shoulder: Secondary | ICD-10-CM

## 2020-03-03 NOTE — Telephone Encounter (Signed)
Please advise. We sent to [facility] because patient requested hand specialist.

## 2020-03-03 NOTE — Telephone Encounter (Signed)
Patient called.   Needs a new referral to a hand specialist that is within the [facility]. Said the current one is not.   Call back: [phone]
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
## 2020-03-04 NOTE — Telephone Encounter (Signed)
Ok to send to OT at [employer] [address]. thanks

## 2020-03-04 NOTE — Telephone Encounter (Signed)
Referral placed.

## 2020-03-04 NOTE — Addendum Note (Signed)
Addended by: Meyer Cory on: 03/04/2020 05:10 PM   Modules accepted: Orders

## 2020-03-09 ENCOUNTER — Other Ambulatory Visit: Payer: Self-pay

## 2020-03-09 ENCOUNTER — Ambulatory Visit: Payer: No Typology Code available for payment source | Attending: Orthopaedic Surgery | Admitting: Physical Therapy

## 2020-03-09 DIAGNOSIS — M542 Cervicalgia: Secondary | ICD-10-CM | POA: Insufficient documentation

## 2020-03-09 DIAGNOSIS — M6281 Muscle weakness (generalized): Secondary | ICD-10-CM | POA: Insufficient documentation

## 2020-03-09 DIAGNOSIS — R293 Abnormal posture: Secondary | ICD-10-CM | POA: Diagnosis present

## 2020-03-09 DIAGNOSIS — M79631 Pain in right forearm: Secondary | ICD-10-CM | POA: Diagnosis present

## 2020-03-09 DIAGNOSIS — M25631 Stiffness of right wrist, not elsewhere classified: Secondary | ICD-10-CM | POA: Diagnosis present

## 2020-03-09 NOTE — Patient Instructions (Signed)
Access Code: [email or web address]: [email or web address]: 06/01/2021Prepared by: [facility]  Seated Upper Trapezius Stretch - 2 x daily - 7 x weekly - 3 reps - 30 hold  Seated Wrist Flexion with Overpressure - 5 x daily - 7 x weekly - 1 sets - 5 reps - 10 seconds hold  Seated Wrist Extension PROM - 3 x daily - 7 x weekly - 1 sets - 5 reps - 10 seconds hold  Forearm Supination PROM - 5 x daily - 7 x weekly - 1 sets - 5 reps - 10 seconds hold   [facility] [address] [place], [place] [address] Phone # [phone] Fax [phone]
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
## 2020-03-11 ENCOUNTER — Other Ambulatory Visit: Payer: Self-pay

## 2020-03-11 ENCOUNTER — Ambulatory Visit: Payer: No Typology Code available for payment source | Admitting: Physical Therapy

## 2020-03-11 DIAGNOSIS — M79631 Pain in right forearm: Secondary | ICD-10-CM

## 2020-03-11 DIAGNOSIS — M25631 Stiffness of right wrist, not elsewhere classified: Secondary | ICD-10-CM

## 2020-03-11 DIAGNOSIS — M6281 Muscle weakness (generalized): Secondary | ICD-10-CM

## 2020-03-11 DIAGNOSIS — M542 Cervicalgia: Secondary | ICD-10-CM
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
## 2020-03-15 ENCOUNTER — Ambulatory Visit: Payer: No Typology Code available for payment source | Admitting: Physical Therapy

## 2020-03-17 ENCOUNTER — Encounter: Payer: No Typology Code available for payment source | Admitting: Physical Therapy

## 2020-03-22 ENCOUNTER — Encounter: Payer: No Typology Code available for payment source | Admitting: Physical Therapy

## 2020-03-25 ENCOUNTER — Encounter: Payer: No Typology Code available for payment source | Admitting: Physical Therapy

## 2020-03-29 ENCOUNTER — Encounter: Payer: No Typology Code available for payment source | Admitting: Physical Therapy

## 2020-04-01 ENCOUNTER — Encounter: Payer: No Typology Code available for payment source | Admitting: Physical Therapy

## 2020-04-15 ENCOUNTER — Encounter: Payer: Self-pay | Admitting: Family Medicine

## 2020-04-15 ENCOUNTER — Ambulatory Visit (INDEPENDENT_AMBULATORY_CARE_PROVIDER_SITE_OTHER): Payer: No Typology Code available for payment source | Admitting: Family Medicine

## 2020-04-15 ENCOUNTER — Other Ambulatory Visit: Payer: Self-pay

## 2020-04-15 VITALS — BP 118/80 | Ht 67.0 in | Wt 129.2 lb

## 2020-04-15 DIAGNOSIS — E7849 Other hyperlipidemia: Secondary | ICD-10-CM

## 2020-04-15 DIAGNOSIS — I341 Nonrheumatic mitral (valve) prolapse: Secondary | ICD-10-CM

## 2020-04-15 DIAGNOSIS — F411 Generalized anxiety disorder: Secondary | ICD-10-CM

## 2020-04-15 DIAGNOSIS — Z0000 Encounter for general adult medical examination without abnormal findings: Secondary | ICD-10-CM | POA: Diagnosis not present

## 2020-04-15 DIAGNOSIS — F339 Major depressive disorder, recurrent, unspecified: Secondary | ICD-10-CM

## 2020-04-15 DIAGNOSIS — K5190 Ulcerative colitis, unspecified, without complications: Secondary | ICD-10-CM

## 2020-04-15 DIAGNOSIS — R2991 Unspecified symptoms and signs involving the musculoskeletal system: Secondary | ICD-10-CM

## 2020-04-15 DIAGNOSIS — Z79899 Other long term (current) drug therapy: Secondary | ICD-10-CM

## 2020-04-15 DIAGNOSIS — Z1283 Encounter for screening for malignant neoplasm of skin: Secondary | ICD-10-CM

## 2020-04-15 NOTE — Patient Instructions (Signed)
Please return in 12 months for your annual complete physical; please come fasting.  Everything looks good! Take care!  If you have any questions or concerns, please don't hesitate to send me a message via MyChart or call the office at [phone]. Thank you for visiting with [place] today! It's our pleasure caring for you.  Please do these things to maintain good health!   Exercise at least 30-45 minutes a day,  4-5 days a week.   Eat a low-fat diet with lots of fruits and vegetables, up to 7-9 servings per day.  Drink plenty of water daily. Try to drink 8 8oz glasses per day.  Seatbelts can save your life. Always wear your seatbelt.  Place Smoke Detectors on every level of your home and check batteries every year.  Schedule an appointment with an eye doctor for an eye exam every 1-2 years  Safe sex - use condoms to protect yourself from STDs if you could be exposed to these types of infections. Use birth control if you do not want to become pregnant and are sexually active.  Avoid heavy alcohol use. If you drink, keep it to less than 2 drinks/day and not every day.  [place].  Choose someone you trust that could speak for you if you became unable to speak for yourself.  Depression is common in our stressful world.If you're feeling down or losing interest in things you normally enjoy, please come in for a visit.  If anyone is threatening or hurting you, please get help. Physical or Emotional Violence is never OK.   \

## 2020-04-15 NOTE — Progress Notes (Signed)
Subjective  Chief Complaint  Patient presents with  . Annual Exam    Fasting labs.     HPI: Lacey Leon is a 59 y.o. female who presents to [facility] [place] today for a Female Wellness Visit. She also has the concerns and/or needs as listed above in the chief complaint. These will be addressed in addition to the Health Maintenance Visit.   Wellness Visit: annual visit with health maintenance review and exam without Pap   HM: declines mammogram at this time. Other screens are up to date. Reviewed recent lab work in chart.  Chronic disease f/u and/or acute problem visit: (deemed necessary to be done in addition to the wellness visit):  Mood: Depression anxiety, seeing psychiatrist, maintained on Zoloft and nightly benzodiazepine.  Reports she is doing very well.  Working from home.  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  No symptoms of depression, no panic attacks.  Familial hyperlipidemia: Recently checked in April.  Low cardiovascular risk score.  Eats healthy  Ulcerative colitis in remission.  Feels well  Requesting dermatology referral for skin cancer screening.  Has multiple moles.  No worrisome lesions that she has noticed  Mild mitral valve prolapse with marfanoid habitus: Reviewed echocardiogram from last year.  Mild MR.  No symptoms.  Assessment  1. Annual physical exam   2. Major depression, recurrent, chronic ([place])   3. Generalized anxiety disorder   4. Familial hyperlipidemia   5. Ulcerative colitis without complications, unspecified location ([place])   6. Chronic prescription benzodiazepine use   7. Skin cancer screening   8. Marfanoid habitus   9. Mild mitral valve prolapse      Plan  Female Wellness Visit:  Age appropriate Health Maintenance and Prevention measures were discussed with patient. Included topics are cancer screening recommendations, ways to keep healthy (see AVS) including dietary and  exercise recommendations, regular eye and dental care, use of seat belts, and avoidance of moderate alcohol use and tobacco use.  Defer mammogram for 1 year.  Education given.  Recommend eye exam  BMI: discussed patient's BMI and encouraged positive lifestyle modifications to help get to or maintain a target BMI.  HM needs and immunizations were addressed and ordered. See below for orders. See HM and immunization section for updates.  Up-to-date  Routine labs and screening tests ordered including cmp, cbc and lipids where appropriate.  Discussed recommendations regarding Vit D and calcium supplementation (see AVS)  Chronic disease management visit and/or acute problem visit:  Depression anxiety is well controlled.  Continue with psychiatry.  Lab work is all stable.  Follow up: 12 months for complete physical Orders Placed This Encounter  Procedures  . Ambulatory referral to Dermatology   No orders of the defined types were placed in this encounter.     Lifestyle: Body mass index is 20.24 kg/m. Wt Readings from Last 3 Encounters:  04/15/20 129 lb 3.2 oz (58.6 kg)  02/24/20 128 lb (58.1 kg)  02/03/20 128 lb (58.1 kg)     Patient Active Problem List   Diagnosis Date Noted  . Familial hyperlipidemia 06/23/2019    Priority: High  . Major depression, recurrent, chronic ([place]) 11/01/2018    Priority: High    On zoloft since age 23   . Chronic prescription benzodiazepine use 11/01/2018    Priority: High  . Generalized anxiety disorder 12/22/2015    Priority: High  . Ulcerative colitis ([place]) 12/22/2015  Priority: High  . Mild mitral valve prolapse 04/15/2020    Priority: Medium    Echocardiogram 2020, mild MR   . 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 Closed fracture of right distal radius 12/10/2019  . Closed fracture of right distal radius and ulna 12/08/2019  . Situational anxiety  12/23/2015   Health Maintenance  Topic Date Due  . MAMMOGRAM  04/15/2022 (Originally 06/24/2011)  . PAP SMEAR-Modifier  06/19/2024  . COLONOSCOPY  11/01/2025  . TETANUS/TDAP  09/13/2028  . Hepatitis C Screening  Completed  . INFLUENZA VACCINE  Discontinued  . HIV Screening  Discontinued    There is no immunization history on file for this patient. We updated and reviewed the patient's past history in detail and it is documented below. Allergies: Patient is allergic to ciprofloxacin, clarithromycin, and other. Past Medical History Patient  has a past medical history of Anxiety, Arthritis, Chronic prescription benzodiazepine use (5/80/9983), Complication of anesthesia, Familial hyperlipidemia (06/23/2019), GERD (gastroesophageal reflux disease), Major depression, recurrent, chronic ([place]) (11/01/2018), Migraine headache (12/22/2015), Mitral valve prolapse, Reaction, adjustment, with depressed mood, prolonged (01/07/2016), and Ulcerative colitis ([place]). Past Surgical History Patient  has a past surgical history that includes Breast biopsy (2010); Wisdom tooth extraction; Colonoscopy; and Open reduction internal fixation (orif) distal radial fracture (Right, 12/10/2019). Family History: Patient family history includes Depression in her mother; Heart disease in her father. Social History:  Patient  reports that she has never smoked. She has never used smokeless tobacco. She reports that she does not drink alcohol and does not use drugs.  Review of Systems: Constitutional: negative for fever or malaise Ophthalmic: negative for photophobia, double vision or loss of vision Cardiovascular: negative for chest pain, dyspnea on exertion, or new LE swelling Respiratory: negative for SOB or persistent cough Gastrointestinal: negative for abdominal pain, change in bowel habits or melena Genitourinary: negative for dysuria or gross hematuria, no abnormal uterine bleeding or disharge Musculoskeletal: negative  for new gait disturbance or muscular weakness Integumentary: negative for new or persistent rashes, no breast lumps Neurological: negative for TIA or stroke symptoms Psychiatric: negative for SI or delusions Allergic/Immunologic: negative for hives  Patient Care Team    Relationship Specialty Notifications Start End  Leamon Arnt, MD PCP - General Family Medicine  11/01/18   Juanita Craver, MD Consulting Physician Gastroenterology  11/01/18   [facility]    11/01/18   Roseanne Kaufman, MD Consulting Physician Orthopedic Surgery  03/05/20   Eino Farber, PA-C Consulting Physician Psychiatry  04/15/20     Objective  Vitals: BP 118/80   Ht 5' 7"  (1.702 m)   Wt 129 lb 3.2 oz (58.6 kg)   BMI 20.24 kg/m  General:  Well developed, well nourished, no acute distress  Psych:  Alert and orientedx3,normal mood and affect HEENT:  Normocephalic, atraumatic, non-icteric sclera,  supple neck without adenopathy, mass or thyromegaly Cardiovascular:  Normal S1, S2, RRR without gallop, rub or murmur Respiratory:  Good breath sounds bilaterally, CTAB with normal respiratory effort Gastrointestinal: normal bowel sounds, soft, non-tender, no noted masses. No HSM MSK: no deformities, contusions. Joints are without erythema or swelling.  Skin:  Warm, no rashes or suspicious lesions noted Neurologic:    Mental status is normal. CN 2-11 are normal. Gross motor and sensory exams are normal. Normal gait. No tremor Breast Exam: No mass, skin retraction or nipple discharge is appreciated  in either breast. No axillary adenopathy. Fibrocystic changes are not noted  Lab Results  Component Value Date   CHOL 279 (H) 01/28/2020   CHOL 326 (H) 06/20/2019   Lab Results  Component Value Date   HDL 99.90 01/28/2020   HDL 119.00 06/20/2019   Lab Results  Component Value Date   LDLCALC 169 (H) 01/28/2020   LDLCALC 197 (H) 06/20/2019   Lab Results  Component Value Date   TRIG 48.0 01/28/2020   TRIG  48.0 06/20/2019   Lab Results  Component Value Date   CHOLHDL 3 01/28/2020   CHOLHDL 3 06/20/2019   No results found for: LDLDIRECT Lab Results  Component Value Date   CREATININE 0.70 01/28/2020   BUN 17 01/28/2020   NA 133 (L) 01/28/2020   K 3.9 01/28/2020   CL 98 01/28/2020   CO2 29 01/28/2020   The 10-year ASCVD risk score Mikey Bussing DC Jr., et al., 2013) is: 1.5%   Values used to calculate the score:     Age: 80 years     Sex: Female     Is Non-Hispanic African American: No     Diabetic: No     Tobacco smoker: No     Systolic Blood Pressure: 707 mmHg     Is BP treated: No     HDL Cholesterol: 99.9 mg/dL     Total Cholesterol: 279 mg/dL Lab Results  Component Value Date   TSH 1.67 06/20/2019   Lab Results  Component Value Date   WBC 4.3 12/10/2019   HGB 12.4 12/10/2019   HCT 38.9 12/10/2019   MCV 79.9 (L) 12/10/2019   PLT 168 12/10/2019     Commons side effects, risks, benefits, and alternatives for medications and treatment plan prescribed today were discussed, and the patient expressed understanding of the given instructions. Patient is instructed to call or message via MyChart if he/she has any questions or concerns regarding our treatment plan. No barriers to understanding were identified. We discussed Red Flag symptoms and signs in detail. Patient expressed understanding regarding what to do in case of urgent or emergency type symptoms.   Medication list was reconciled, printed and provided to the patient in AVS. Patient instructions and summary information was reviewed with the patient as documented in the AVS. This note was prepared with assistance of Dragon voice recognition software. Occasional wrong-word or sound-a-like substitutions may have occurred due to the inherent limitations of voice recognition software  This visit occurred during the SARS-CoV-2 public health emergency.  Safety protocols were in place, including screening questions prior to the visit,  additional usage of staff PPE, and extensive cleaning of exam room while observing appropriate contact time as indicated for disinfecting solutions.

## 2020-04-27 ENCOUNTER — Ambulatory Visit: Payer: No Typology Code available for payment source | Admitting: Orthopaedic Surgery

## 2020-05-03 ENCOUNTER — Telehealth: Payer: Self-pay | Admitting: Family Medicine

## 2020-05-03 NOTE — Telephone Encounter (Signed)
Please advise 

## 2020-05-03 NOTE — Telephone Encounter (Signed)
Pt called asking if Dr. Jonni Sanger could write her an exemption note for pt to not get COVID vaccine. Pt states she does not want to get the vaccine due to her auto-immune disease. Pt also states she works from home and does not step foot in any [facility] facilities for work. Please advise.

## 2020-05-06 MED FILL — LORazepam 1 MG TABS: 1 | 90 days supply | Qty: 90 | Fill #0

## 2020-05-07 NOTE — Telephone Encounter (Signed)
Called patient gave information. Patient disconnected call before I could make sure she had no further questions or if she would like letter stating she worked from home.

## 2020-05-07 NOTE — Telephone Encounter (Signed)
I cannot write medical excuse. Auto-immune disease is actually an indication.  I recommend the vaccine but she can make her choices.  You can write a note saying she works from home.   Thanks.

## 2020-05-19 ENCOUNTER — Telehealth: Payer: Self-pay

## 2020-05-19 NOTE — Telephone Encounter (Signed)
Pt wants to change her appt on 8/13 to discuss covid vaccine concerns to a phone call. I informed her that we can do a video visit , but pt was adamant that she can not do a video visit. She says her phone battery dies quickly when she uses the camera. I told pt that I would send a message back to see what could be done, but that we don't often do telephone calls.

## 2020-05-19 NOTE — Telephone Encounter (Signed)
Can't do telephone only visits.  Regarding covid vaccine requirement: she does not meet the exemption requirements medically.   Can do office visit if she'd like.   Thanks, Dr. Jonni Sanger

## 2020-05-19 NOTE — Telephone Encounter (Signed)
Please advise 

## 2020-05-19 NOTE — Telephone Encounter (Signed)
Informed patient visit has to be virtual or in office. Patient declined and stated she would just cancel her appt. Visit has been cancelled

## 2020-05-21 ENCOUNTER — Ambulatory Visit: Payer: No Typology Code available for payment source | Admitting: Family Medicine

## 2020-05-21 ENCOUNTER — Telehealth: Payer: Self-pay

## 2020-05-21 ENCOUNTER — Encounter: Payer: Self-pay | Admitting: Family Medicine

## 2020-05-21 NOTE — Telephone Encounter (Signed)
A letter and form have been signed by Dr. Jonni Sanger exempting the Ms. Stl[place]is from getting the covid vaccine. Form and letter were emailed to [email or web address]. patient has been notified via [employer].

## 2020-06-01 ENCOUNTER — Telehealth: Payer: Self-pay

## 2020-06-01 NOTE — Telephone Encounter (Signed)
Please send letter.  "per advice of her hand specialist"

## 2020-06-01 NOTE — Telephone Encounter (Signed)
Pt is requesting an okay to get a JAS device for the extension her wrist. Her ortho Dr is concerned about the extension in her right wrist, so they are recommending this device to assist with her wrist. She is asking that a letter be sent to her insurance, [employer]. In order to get this device she has to have a letter from her PCP sent in.  *Recommended by dr Amedeo Plenty at [facility]

## 2020-06-01 NOTE — Telephone Encounter (Signed)
Please advise 

## 2020-06-02 ENCOUNTER — Encounter: Payer: Self-pay | Admitting: Family Medicine

## 2020-06-02 NOTE — Telephone Encounter (Signed)
Letter written and mailed to :  [place] [address]

## 2020-06-15 ENCOUNTER — Telehealth: Payer: Self-pay | Admitting: Family Medicine

## 2020-06-15 NOTE — Telephone Encounter (Signed)
Think I have put this in your review folder.

## 2020-06-15 NOTE — Telephone Encounter (Signed)
Spoke with patient about JAS device. Pt states she needs the device to improve her wrist mobility. Dr. Amedeo Plenty put a referral in for the [place]. Please call patient when there is an update.

## 2020-06-15 NOTE — Telephone Encounter (Signed)
[employer] is calling in this morning asking if we received a fax that was sent for medical equipment, first fax was sent on 8/26 then again on 9/1 -verified fax number was correct.

## 2020-06-17 NOTE — Telephone Encounter (Signed)
Paperwork has been faxed. Confirmation fax has been received.

## 2020-06-17 NOTE — Telephone Encounter (Signed)
Letter written for patient. Printed. Placed with other paperwork. Please fax.

## 2020-06-18 ENCOUNTER — Telehealth: Payer: Self-pay | Admitting: Family Medicine

## 2020-06-18 NOTE — Telephone Encounter (Signed)
[employer] states that we need to fax all of the information that they sent [place] to [place] for the referral for this device. It cannot be just one letter- they require detailed information caller states. If any questions is needed please call [employer] to the number below

## 2020-06-18 NOTE — Telephone Encounter (Signed)
All paperwork has been faxed to [place]. Confirmation fax has been received.

## 2020-06-18 NOTE — Telephone Encounter (Signed)
Patient is calling in stating that the referral for [facility] was wrong, and is needing a JAS device to use at home, it needs to be called in to her insurance so they are able to cover it.

## 2020-06-21 ENCOUNTER — Ambulatory Visit: Payer: No Typology Code available for payment source | Admitting: Family Medicine

## 2020-06-22 NOTE — Telephone Encounter (Signed)
JAS is needing an auth placed with [employer] . The paperwork sent in previously was incorrect.   If any questions [id number] option 1 then option 2 . This gets you to someone that can help with this from [place] for help from [place]

## 2020-06-22 NOTE — Telephone Encounter (Signed)
Refaxed all paperwork to [facility] with [employer]. We will get approval or denial in 3-4 business days   fax # [phone]

## 2020-06-24 NOTE — Telephone Encounter (Signed)
Kim from [place] is calling stating we need to list  paperwork  JAS  as Attending provider or servicing provider but we need to place authorization, we are placing authorization for [place]  FAX # [phone]  If we have any questions we can call back [phone] ext [phone]

## 2020-06-28 NOTE — Telephone Encounter (Signed)
Spoke with JAS, informed that I am waiting on PA approval from [place] ext [phone]

## 2020-08-09 ENCOUNTER — Other Ambulatory Visit (HOSPITAL_COMMUNITY): Payer: Self-pay | Admitting: Physician Assistant

## 2020-08-13 MED FILL — LORazepam 0.5 MG TABS: 0.5 | 30 days supply | Qty: 45 | Fill #0

## 2020-08-13 MED FILL — SERTRALINE HCL 25 MG TABLET: 25 | 90 days supply | Qty: 90 | Fill #0

## 2020-10-12 ENCOUNTER — Ambulatory Visit: Payer: No Typology Code available for payment source | Admitting: Dermatology

## 2020-10-20 ENCOUNTER — Other Ambulatory Visit: Payer: Self-pay

## 2020-10-20 ENCOUNTER — Encounter: Payer: Self-pay | Admitting: Family Medicine

## 2020-10-20 ENCOUNTER — Telehealth (INDEPENDENT_AMBULATORY_CARE_PROVIDER_SITE_OTHER): Payer: No Typology Code available for payment source | Admitting: Family Medicine

## 2020-10-20 VITALS — Temp 99.4°F

## 2020-10-20 DIAGNOSIS — J069 Acute upper respiratory infection, unspecified: Secondary | ICD-10-CM | POA: Diagnosis not present

## 2020-10-20 DIAGNOSIS — H6983 Other specified disorders of Eustachian tube, bilateral: Secondary | ICD-10-CM | POA: Diagnosis not present

## 2020-10-20 DIAGNOSIS — J0110 Acute frontal sinusitis, unspecified: Secondary | ICD-10-CM | POA: Diagnosis not present

## 2020-10-20 MED ORDER — PREDNISONE 20 MG PO TABS: 40.0000 mg | ORAL_TABLET | Freq: Every day | ORAL | 0 refills | Status: AC

## 2020-10-20 NOTE — Progress Notes (Signed)
Chief Complaint  Patient presents with  . Sore Throat  . Fever    Swollen glands  . Cough  . Fatigue    Lacey Leon here for URI complaints. Due to COVID-19 pandemic, we are interacting via telephone. I verified patient's ID using 2 identifiers. Patient agreed to proceed with visit via this method. Patient is at home, I am at office. Patient and I are present for visit.   Duration: 2 weeks was getting better and then worsened over past 5 d Associated symptoms: Fever (99.5 F), sinus congestion, sinus pain, rhinorrhea, itchy watery eyes, ear fullness, difficulty swallowing, myalgia and cough, fatigue, swollen glands/LN's b/l Denies: ear drainage, wheezing, shortness of breath and N/V/D Treatment to date: Mucinex, INCS, pushing fluids,  Sick contacts: Friend had same s/s's who she saw on Xmas  Past Medical History:  Diagnosis Date  . Anxiety   . Arthritis   . Chronic prescription benzodiazepine use 11/01/2018  . Complication of anesthesia    unable to void for several days after anesthesia  . Familial hyperlipidemia 06/23/2019   Excellent HDL but LDL 197 06/2019; recommended statin  . GERD (gastroesophageal reflux disease)   . Major depression, recurrent, chronic ([place]) 11/01/2018   On zoloft since age 13  . Migraine headache 12/22/2015  . Mitral valve prolapse   . Reaction, adjustment, with depressed mood, prolonged 01/07/2016  . Ulcerative colitis ([place])    Exam Temp 99.4 F (37.4 C) (Oral) Comment (Src): taken last night  SpO2 98%  No conversational dyspnea Age appropriate judgment and insight Nml affect and mood  Viral URI with cough - Plan: predniSONE (DELTASONE) 20 MG tablet  Acute frontal sinusitis, recurrence not specified - Plan: predniSONE (DELTASONE) 20 MG tablet  Dysfunction of both eustachian tubes - Plan: predniSONE (DELTASONE) 20 MG tablet  5 d pred burst, 40 mg/d. If no better, she will let me know. W hx of bowel issues, she does not do well with abx. Does best  with Zpak. Discussed case with pharmacy, [name] should be easiest. Could do 5 d course of daily Rocephin, hopefully [name] could help accommodate.  Continue to push fluids, practice good hand hygiene, cover mouth when coughing. F/u prn. If starting to experience fevers, shaking, or shortness of breath, seek immediate care. Total time: 21 min Pt voiced understanding and agreement to the plan.  [place], DO 10/20/20 4:38 PM

## 2020-10-25 ENCOUNTER — Telehealth: Payer: Self-pay

## 2020-10-25 NOTE — Telephone Encounter (Signed)
[facility] Night - Client TELEPHONE ADVICE RECORD [facility] Patient Name: Lacey Leon Gender: Female DOB: June 27, 1961 Age: 60 Y 3 M 24 D Return Phone Number: [phone] (Primary) Address: City/State/Zip: [place] [address] Client [facility] Night - Client Client Site [facility] - Night Physician Nani Ravens, - MD Contact Type Call Who Is Calling Patient / Member / Family / Caregiver Call Type Triage / Clinical Relationship To Patient Self Return Phone Number [phone] (Primary) Chief Complaint Earache Reason for Call Symptomatic / Request for [place] states she has ear pain, low grade fever, chills and swollen glands but she is still taking the Prednisone she was prescribed . Caller states she would like an injectable antibiotic since she does have Ulcerative colitis. Caller states she would like to speak with the on call provider to know what woul dbe the best medication for her. Caller states she would like to use [facility] since they deliver. Translation No Nurse Assessment Nurse: Jearld Pies, RN, Lovena Le Date/Time Eilene Ghazi Time): 10/25/2020 10:14:12 AM Confirm and document reason for call. If symptomatic, describe symptoms. ---Caller states she has ear pain, low grade fever, chills, cough, and congestion and swollen glands for 2 weeks but she is still taking the Prednisone she was prescribed last wed through virtual visit. Current pain level 2/10. No pain reliever taken. Slight redness on outer ear. 99.65F highest temp. Drinking and urinating normally. Denies ear drainage/swelling, difficulty breathing, chest pain, or any other symptoms at this time. Does the patient have any new or worsening symptoms? ---Yes Will a triage be completed? ---Yes Related visit to physician within the last 2 weeks? ---No Does the PT have any chronic conditions? (i.e. diabetes, asthma,  this includes High risk factors for pregnancy, etc.) ---Yes List chronic conditions. ---Ulcerative Colitis Is this a behavioral health or substance abuse call? ---No Guidelines Guideline Title Affirmed Question Affirmed Notes Nurse Date/Time (Eastern Time) Earache Earache (Exceptions: brief ear pain of < 60 Jake Bathe 10/25/2020 10:19:22 AM PLEASE NOTE: All timestamps contained within this report are represented as [place] Standard Time. CONFIDENTIALTY NOTICE: This fax transmission is intended only for the addressee. It contains information that is legally privileged, confidential or otherwise protected from use or disclosure. If you are not the intended recipient, you are strictly prohibited from reviewing, disclosing, copying using or disseminating any of this information or taking any action in reliance on or regarding this information. If you have received this fax in error, please notify us immediately by telephone so that we can arrange for its return to [place]. Phone: [phone], Toll-Free: [phone], Fax: [phone] Page: 2 of 2 Call Id: [id number] Guidelines Guideline Title Affirmed Question Affirmed Notes Nurse Date/Time Eilene Ghazi Time) minutes duration, earache occurring during air travel [place]. Time Eilene Ghazi Time) Disposition Final User 10/25/2020 10:22:51 AM See PCP within 24 Hours Yes Jearld Pies, RN, Apolonio Schneiders Disagree/Comply Comply Caller Understands Yes PreDisposition Call Doctor Care Advice Given Per Guideline SEE PCP WITHIN 24 HOURS: * For pain relief, you can take either acetaminophen, ibuprofen, or naproxen. PAIN MEDICINES: * They are over-the-counter (OTC) pain drugs. You can buy them at the drugstore. * ACETAMINOPHEN - REGULAR STRENGTH TYLENOL: Take 650 mg (two 325 mg pills) by mouth every 4 to 6 hours as needed. Each Regular Strength Tylenol pill has 325 mg of acetaminophen. The most you should take each day is 3,250 mg (10 pills a day). * You become worse *  Severe  pain persists over 2 hours after pain medicine CALL BACK IF: LOCAL COLD: * Apply a cold pack or a cold wet washcloth to outer ear for 20 min. to reduce pain while medicine takes effect (Note: some adults prefer local heat for 20 minutes) Referrals REFERRED TO PCP OFFICE

## 2020-10-25 NOTE — Telephone Encounter (Signed)
Discussed with pharmacy team as omnicef would be best option as typical agents we would use are not as desirable with her GI hx. IM Rocephin is certainly overkill but would be easier on her stomach. Would write for 1 g daily for 5 d.

## 2020-10-26 ENCOUNTER — Encounter: Payer: Self-pay | Admitting: Family Medicine

## 2020-10-26 ENCOUNTER — Other Ambulatory Visit: Payer: Self-pay

## 2020-10-26 ENCOUNTER — Ambulatory Visit (INDEPENDENT_AMBULATORY_CARE_PROVIDER_SITE_OTHER): Payer: No Typology Code available for payment source | Admitting: Family Medicine

## 2020-10-26 VITALS — BP 108/68 | HR 104 | Temp 98.4°F | Ht 67.0 in | Wt 129.1 lb

## 2020-10-26 DIAGNOSIS — H938X1 Other specified disorders of right ear: Secondary | ICD-10-CM

## 2020-10-26 NOTE — Patient Instructions (Addendum)
Keep the diet clean and stay active.   Your ears look great. Consider rinsing the ears out.   Double up on your Flonase for the next few weeks.   Let me know if you are still having issues over the next few weeks and we can set you up with ENT.   Let us know if you need anything.

## 2020-10-26 NOTE — Progress Notes (Signed)
Chief Complaint  Patient presents with  . Ear Pain    Lacey Leon here for f/u.    Past Medical History:  Diagnosis Date  . Anxiety   . Arthritis   . Chronic prescription benzodiazepine use 11/01/2018  . Complication of anesthesia    unable to void for several days after anesthesia  . Familial hyperlipidemia 06/23/2019   Excellent HDL but LDL 197 06/2019; recommended statin  . GERD (gastroesophageal reflux disease)   . Major depression, recurrent, chronic ([place]) 11/01/2018   On zoloft since age 48  . Migraine headache 12/22/2015  . Mitral valve prolapse   . Reaction, adjustment, with depressed mood, prolonged 01/07/2016  . Ulcerative colitis ([facility])     BP 108/68 (BP Location: Left Arm, Patient Position: Sitting, Cuff Size: Normal)   Pulse (!) 104   Temp 98.4 F (36.9 C) (Oral)   Ht 5' 7"  (1.702 m)   Wt 129 lb 2 oz (58.6 kg)   SpO2 96%   BMI 20.22 kg/m  General: Awake, alert, appears stated age HEENT: AT, [place], ears patent b/l and TM's neg, nares patent w/o discharge, pharynx pink and without exudates, MMM MSK: No TTP over TMJ b/l, jaw deviation or clicking Neck: No masses or asymmetry Heart: reg rhythm, tachycardic (pt stated she was nervous about prospect of starting an antibiotic) Lungs: CTAB, no accessory muscle use Psych: Age appropriate judgment and insight, normal mood and affect  Sensation of fullness in right ear  She completed a course of pred. Cont Flonase, would double up for next 2 weeks. Consider Afrin for 2-3 days in addition to PO antihist. If no improvement in next 2-3 weeks, will refer to ENT.  Glad we didn't start abx.  F/u prn.  Pt voiced understanding and agreement to the plan.  [place], DO 10/26/20 3:51 PM

## 2020-10-26 NOTE — Telephone Encounter (Signed)
Have addressed through my chart this morning. Scheduled with Dr. Nani Ravens today 10/26/2020

## 2020-11-12 MED FILL — LORAZEPAM 0.5 MG TABS: 0.5 | 30 days supply | Qty: 45 | Fill #1

## 2020-11-26 ENCOUNTER — Other Ambulatory Visit (HOSPITAL_COMMUNITY): Payer: Self-pay | Admitting: Physician Assistant

## 2020-11-26 MED FILL — SERTRALINE HCL 100 MG TABS: 100 | 90 days supply | Qty: 90 | Fill #0

## 2020-12-06 MED FILL — LORAZEPAM 0.5 MG TABS: 0.5 | 30 days supply | Qty: 70 | Fill #0

## 2020-12-28 ENCOUNTER — Other Ambulatory Visit (HOSPITAL_BASED_OUTPATIENT_CLINIC_OR_DEPARTMENT_OTHER): Payer: Self-pay

## 2021-01-06 ENCOUNTER — Other Ambulatory Visit (HOSPITAL_COMMUNITY): Payer: Self-pay | Admitting: Physician Assistant

## 2021-01-06 ENCOUNTER — Other Ambulatory Visit (HOSPITAL_BASED_OUTPATIENT_CLINIC_OR_DEPARTMENT_OTHER): Payer: Self-pay

## 2021-01-06 MED FILL — SERTRALINE HCL 25 MG TABLET: 25 | 30 days supply | Qty: 30 | Fill #0

## 2021-01-07 MED FILL — LORAZEPAM 0.5 MG TABS: 0.5 | 30 days supply | Qty: 70 | Fill #1

## 2021-01-17 ENCOUNTER — Other Ambulatory Visit (HOSPITAL_COMMUNITY): Payer: Self-pay

## 2021-01-17 MED ORDER — LORAZEPAM 0.5 MG PO TABS
ORAL_TABLET | ORAL | 1 refills | Status: DC
Start: 2021-01-17 — End: 2021-03-18
  Filled 2021-01-17 – 2021-02-03 (×2): qty 70, 23d supply, fill #0
  Filled 2021-03-17: qty 70, 23d supply, fill #1

## 2021-02-03 ENCOUNTER — Other Ambulatory Visit (HOSPITAL_COMMUNITY): Payer: Self-pay

## 2021-02-04 ENCOUNTER — Other Ambulatory Visit (HOSPITAL_COMMUNITY): Payer: Self-pay

## 2021-02-16 ENCOUNTER — Ambulatory Visit (INDEPENDENT_AMBULATORY_CARE_PROVIDER_SITE_OTHER): Payer: No Typology Code available for payment source | Admitting: Dermatology

## 2021-02-16 ENCOUNTER — Encounter: Payer: Self-pay | Admitting: Dermatology

## 2021-02-16 ENCOUNTER — Other Ambulatory Visit: Payer: Self-pay

## 2021-02-16 DIAGNOSIS — D1801 Hemangioma of skin and subcutaneous tissue: Secondary | ICD-10-CM

## 2021-02-16 DIAGNOSIS — L905 Scar conditions and fibrosis of skin: Secondary | ICD-10-CM

## 2021-02-16 DIAGNOSIS — Z1283 Encounter for screening for malignant neoplasm of skin: Secondary | ICD-10-CM

## 2021-02-16 DIAGNOSIS — I781 Nevus, non-neoplastic: Secondary | ICD-10-CM

## 2021-02-16 DIAGNOSIS — L821 Other seborrheic keratosis: Secondary | ICD-10-CM | POA: Diagnosis not present

## 2021-02-16 NOTE — Patient Instructions (Signed)
Broken Capillaries on nose matte telangiectasia.

## 2021-02-21 ENCOUNTER — Other Ambulatory Visit (HOSPITAL_COMMUNITY): Payer: Self-pay

## 2021-02-21 MED ORDER — SERTRALINE HCL 100 MG PO TABS
ORAL_TABLET | ORAL | 0 refills | Status: DC
  Filled 2021-02-21: qty 90, 90d supply, fill #0

## 2021-03-01 ENCOUNTER — Encounter: Payer: Self-pay | Admitting: Dermatology

## 2021-03-01 NOTE — Progress Notes (Signed)
   New Patient   Subjective  Lacey Leon is a 60 y.o. female who presents for the following: Annual Exam (Waist up exam check possible moles on her back and facial red lesions).  General skin examination Location:  Duration:  Quality:  Associated Signs/Symptoms: Modifying Factors:  Severity:  Timing: Context:    The following portions of the chart were reviewed this encounter and updated as appropriate:  Tobacco  Allergies  Meds  Problems  Med Hx  Surg Hx  Fam Hx      Objective  Well appearing patient in no apparent distress; mood and affect are within normal limits. Objective  Left Forearm - Anterior: Multiple spots on abdomen plus small spots on extremities, 1 to 3 mm smooth red papules  Objective  Left Inguinal Area: Waist up skin examination, no atypical pigmented lesions or nonmelanoma skin cancer.  Objective  Left Upper Back: Textured light brown 5 mm flattopped papule  Objective  Right Forearm - Anterior: Due to injury and surgery per patient  Objective  Mid Supratip of Nose: Matte telangiectasia for millimeter macule; no palpability and dermoscopy compatible.    All skin waist up examined.  Areas beneath undergarments not fully examined.   Assessment & Plan  Cherry angioma Left Forearm - Anterior  No intervention necessary  Screening exam for skin cancer Left Inguinal Area  Yearly skin exam, encouraged to self examine twice annually.  Continued ultraviolet protection.  Seborrheic keratosis Left Upper Back  Leave if stable  Scar Right Forearm - Anterior  Telangiectasia Mid Supratip of Nose  Discussed nonintervention if the lesion is stable versus micro cautery or vascular laser.  To return if there is growth or bleeding.

## 2021-03-08 ENCOUNTER — Ambulatory Visit: Payer: No Typology Code available for payment source | Admitting: Dermatology

## 2021-03-17 ENCOUNTER — Other Ambulatory Visit: Payer: Self-pay | Admitting: Family Medicine

## 2021-03-17 ENCOUNTER — Other Ambulatory Visit (HOSPITAL_COMMUNITY): Payer: Self-pay

## 2021-03-18 ENCOUNTER — Other Ambulatory Visit (HOSPITAL_COMMUNITY): Payer: Self-pay

## 2021-03-18 MED ORDER — LORAZEPAM 0.5 MG PO TABS
ORAL_TABLET | ORAL | 0 refills | Status: DC
  Filled 2021-03-18: qty 70, 23d supply, fill #0

## 2021-03-21 ENCOUNTER — Ambulatory Visit (HOSPITAL_COMMUNITY): Payer: Self-pay | Admitting: Physician Assistant

## 2021-03-23 ENCOUNTER — Ambulatory Visit (HOSPITAL_COMMUNITY): Payer: No Typology Code available for payment source | Admitting: Physician Assistant

## 2021-04-04 ENCOUNTER — Other Ambulatory Visit (HOSPITAL_COMMUNITY): Payer: Self-pay

## 2021-04-04 MED ORDER — SERTRALINE HCL 100 MG PO TABS
100.0000 mg | ORAL_TABLET | Freq: Every evening | ORAL | 0 refills | Status: DC
  Filled 2021-04-04: qty 90, 90d supply, fill #0

## 2021-04-04 MED ORDER — LORAZEPAM 0.5 MG PO TABS: ORAL_TABLET | ORAL | 0 refills | Status: DC

## 2021-04-06 ENCOUNTER — Encounter: Payer: Self-pay | Admitting: Family Medicine

## 2021-04-06 ENCOUNTER — Other Ambulatory Visit (HOSPITAL_COMMUNITY): Payer: Self-pay

## 2021-04-06 ENCOUNTER — Other Ambulatory Visit: Payer: Self-pay

## 2021-04-06 ENCOUNTER — Ambulatory Visit (INDEPENDENT_AMBULATORY_CARE_PROVIDER_SITE_OTHER): Payer: No Typology Code available for payment source | Admitting: Family Medicine

## 2021-04-06 VITALS — BP 88/60 | HR 86 | Temp 98.0°F | Ht 67.0 in | Wt 122.0 lb

## 2021-04-06 DIAGNOSIS — H9203 Otalgia, bilateral: Secondary | ICD-10-CM | POA: Diagnosis not present

## 2021-04-06 MED ORDER — PREDNISONE 20 MG PO TABS
ORAL_TABLET | ORAL | 0 refills | Status: AC
  Filled 2021-04-06: qty 10, 5d supply, fill #0

## 2021-04-06 NOTE — Progress Notes (Signed)
Subjective  CC:  Chief Complaint  Patient presents with   Ear Problem    Pt complains of burning, itching and mild pain in bilateral ears.     HPI: Lacey Leon is a 60 y.o. female who presents to the office today to address the problems listed above in the chief complaint. C/o pressure and discomfort both ears for several days. Feels same as she had in early January. I reviewed the 2 visits she had for this; treated w/ pred, flonase and zyrtec and resolved. Denies URIs. Using zyrtec and flonase w/o relief. No fevers.    Assessment  1. Otalgia of both ears      Plan  Ear pain:  with serous effusions; ? Allergies. 5 day pred burst and monitor.   Follow up: prn. Due cpe.   Visit date not found  No orders of the defined types were placed in this encounter.  Meds ordered this encounter  Medications   predniSONE (DELTASONE) 20 MG tablet    Sig: Take 2 tablets by mouth daily for 5 days    Dispense:  10 tablet    Refill:  0      I reviewed the patients updated PMH, FH, and SocHx.    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   LORazepam (ATIVAN) 0.5 MG tablet TAKE 1-3 TABLETS BY MOUTH DAILY FOR ANXIETY. 30 DAY SUPPLY.   predniSONE (DELTASONE) 20 MG tablet Take 2 tablets by mouth daily  for 5 days   sertraline (ZOLOFT) 100 MG tablet Take 1 tablet (100 mg total) by mouth at bedtime.   [DISCONTINUED] LORazepam (ATIVAN) 0.5 MG tablet Take 1 - 3 tablets by mouth daily for anxiety (30 day supply)   [DISCONTINUED] LORazepam (ATIVAN) 0.5 MG tablet Take 1 to 3 tablets by mouth daily for anxiety   [DISCONTINUED] LORazepam (ATIVAN) 1 MG tablet Take 1 tablet (1 mg total) by mouth 2 (two) times daily as needed for anxiety. (Patient taking differently: Take 0.5 mg by mouth at bedtime.)   [DISCONTINUED] sertraline (ZOLOFT) 100 MG tablet Take 50 mg by mouth at bedtime.    [DISCONTINUED] sertraline (ZOLOFT) 100 MG tablet TAKE 1 TABLET BY MOUTH ONCE DAILY AT BEDTIME   [DISCONTINUED] sertraline (ZOLOFT) 25 MG tablet TAKE 1 TABLET BY MOUTH AT BEDTIME ALONG WITH 100MG TABLET   [DISCONTINUED] sertraline (ZOLOFT) 25 MG tablet TAKE 1 TABLET BY MOUTH EVERY NIGHT AT BEDTIME    Allergies: Patient is allergic to ciprofloxacin, clarithromycin, and other. Family History: Patient family history includes Depression in her mother; Heart disease in her father. Social History:  Patient  reports that she has never smoked. She has never used smokeless tobacco. She reports that she does not drink alcohol and does not use drugs.  Review of Systems: Constitutional: Negative for fever malaise or anorexia Cardiovascular: negative for chest pain Respiratory: negative for SOB or persistent cough Gastrointestinal: negative for abdominal pain  Objective  Vitals: BP (!) 88/60   Pulse 86   Temp 98 F (36.7 C) (Temporal)   Ht 5' 7"  (1.702 m)   Wt 122 lb (55.3 kg)   SpO2 98%   BMI 19.11 kg/m  General: no acute distress , A&Ox3 HEENT: [place]ER[place], conjunctiva normal, neck is supple, TM with effusions, nl landmarks Cardiovascular:  RRR without murmur or gallop.  Respiratory:  Good breath sounds bilaterally, CTAB with normal respiratory effort Skin:  Warm, no rashes    Commons side effects, risks, benefits, and  alternatives for medications and treatment plan prescribed today were discussed, and the patient expressed understanding of the given instructions. Patient is instructed to call or message via MyChart if he/she has any questions or concerns regarding our treatment plan. No barriers to understanding were identified. We discussed Red Flag symptoms and signs in detail. Patient expressed understanding regarding what to do in case of urgent or emergency type symptoms.  Medication list was reconciled, printed and provided to the patient in AVS. Patient instructions and summary information was reviewed with the patient as documented in the AVS. This note was prepared with assistance of Dragon voice recognition software. Occasional wrong-word or sound-a-like substitutions may have occurred due to the inherent limitations of voice recognition software  This visit occurred during the SARS-CoV-2 public health emergency.  Safety protocols were in place, including screening questions prior to the visit, additional usage of staff PPE, and extensive cleaning of exam room while observing appropriate contact time as indicated for disinfecting solutions.

## 2021-04-06 NOTE — Patient Instructions (Signed)
Please follow up if symptoms do not improve or as needed.    Take the 5 days of prednisone to see if this helps.  Stop the hydrogen peroxide.

## 2021-04-07 ENCOUNTER — Telehealth: Payer: Self-pay

## 2021-04-07 ENCOUNTER — Other Ambulatory Visit (HOSPITAL_COMMUNITY): Payer: Self-pay

## 2021-04-07 NOTE — Telephone Encounter (Signed)
Please advise 

## 2021-04-07 NOTE — Telephone Encounter (Signed)
Patient states she was seen by dr.andy yesterday and states that the medication lorazepam 0.5 was discontinued by dr.andy but it was not discussed during apt at all And rely's on this medication states this is urgent and needs to be solved

## 2021-04-07 NOTE — Telephone Encounter (Signed)
Pt says that she needs an explanation as to why her Lorazepam was discontinued. She says that she feels like this was done on purpose because she no longer works at [employer]. She states that she was only seen for her ear pain, so she does not know why her medication was cancelled. She continues to demand an explanation and says that Dr Jonni Sanger needs to call her back herself. She says that she has been the worst Dr. She has ever had. Pt said that she needed help, and she didn't know why this was being done to her. She asked if there were another Dr in the office that could overturn Dr. Tamela Oddi decision to d/c her medication. Pt was told that Dr. Jonni Sanger has already gone for the day, and that if symptoms began to worsen she needs to go to urgent care or the ED. Pt disconnected the call.

## 2021-04-08 NOTE — Telephone Encounter (Signed)
Left message on voicemail to call office.  

## 2021-04-08 NOTE — Telephone Encounter (Signed)
Called Dr. Ysidro Evert office and spoke to [place], told her calling about mutual pt. Explained to her what Dr. Jonni Sanger did and apologized, told her pt said she called and for refill on Lorazepam and was told could not be filled. Carmell Austria said pt did call yesterday and she told pt that she would have to send message to provider to have refilled and that provider was not in office. Carmell Austria said she will try to send another message to see if it can get filled. Told her okay, thank you and sorry again. Carmell [place] verbalized understanding.

## 2021-04-08 NOTE — Telephone Encounter (Signed)
Spoke to Lacey Leon told her Dr. Jonni Sanger did not discontinue medication. It was on your list 3 times so she took the duplicates off. Lacey Leon said she should not have done that they were on there 3 times from Dr. Ysidro Evert office due to not having insurance so they sent in extra prescriptions for me. Lacey Leon very upset that Dr. Jonni Sanger did this without asking her about it. Lacey Leon said she needs refill can not get it cause [facility] says it was discontinued. Lacey Leon says it is our fault and we need to fix this. Lacey Leon said we need to call Dr. Ysidro Evert office so she can get her medication. Told Lacey Leon okay I will call Dr. Ysidro Evert office and explain to them what happened so you can get you medication. Lacey Leon verbalized understanding.

## 2021-04-08 NOTE — Telephone Encounter (Signed)
Patient called back. She stated that someone left her a voicemail and to call the office back

## 2021-04-08 NOTE — Telephone Encounter (Signed)
I removed duplicates from her med list only. I do not prescribe this for her nor did I d/c it.

## 2021-04-08 NOTE — Telephone Encounter (Signed)
Called Dr. Ysidro Evert office at [phone] and left message to call me.

## 2021-04-12 ENCOUNTER — Other Ambulatory Visit (HOSPITAL_COMMUNITY): Payer: Self-pay

## 2021-04-14 ENCOUNTER — Other Ambulatory Visit (HOSPITAL_COMMUNITY): Payer: Self-pay

## 2021-04-18 ENCOUNTER — Other Ambulatory Visit (HOSPITAL_COMMUNITY): Payer: Self-pay

## 2021-04-19 ENCOUNTER — Other Ambulatory Visit (HOSPITAL_COMMUNITY): Payer: Self-pay

## 2021-04-21 ENCOUNTER — Other Ambulatory Visit (HOSPITAL_COMMUNITY): Payer: Self-pay

## 2021-04-26 ENCOUNTER — Other Ambulatory Visit (HOSPITAL_COMMUNITY): Payer: Self-pay

## 2021-04-28 ENCOUNTER — Other Ambulatory Visit (HOSPITAL_COMMUNITY): Payer: Self-pay

## 2021-05-02 ENCOUNTER — Ambulatory Visit (INDEPENDENT_AMBULATORY_CARE_PROVIDER_SITE_OTHER): Payer: No Payment, Other | Admitting: Psychiatry

## 2021-05-02 ENCOUNTER — Other Ambulatory Visit: Payer: Self-pay

## 2021-05-02 ENCOUNTER — Encounter (HOSPITAL_COMMUNITY): Payer: Self-pay

## 2021-05-02 VITALS — BP 88/60 | HR 86 | Ht 67.0 in | Wt 122.0 lb

## 2021-05-02 DIAGNOSIS — F331 Major depressive disorder, recurrent, moderate: Secondary | ICD-10-CM | POA: Insufficient documentation

## 2021-05-02 DIAGNOSIS — F411 Generalized anxiety disorder: Secondary | ICD-10-CM
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
## 2021-05-24 ENCOUNTER — Encounter (HOSPITAL_COMMUNITY): Payer: Self-pay | Admitting: Psychiatry

## 2021-05-24 ENCOUNTER — Encounter (HOSPITAL_COMMUNITY): Payer: Self-pay

## 2021-05-24 ENCOUNTER — Ambulatory Visit (INDEPENDENT_AMBULATORY_CARE_PROVIDER_SITE_OTHER): Payer: No Payment, Other | Admitting: Psychiatry

## 2021-05-24 ENCOUNTER — Other Ambulatory Visit: Payer: Self-pay

## 2021-05-24 ENCOUNTER — Ambulatory Visit (HOSPITAL_COMMUNITY): Payer: No Payment, Other | Admitting: Licensed Clinical Social Worker

## 2021-05-24 VITALS — BP 111/71 | HR 69 | Ht 67.0 in | Wt 121.0 lb

## 2021-05-24 DIAGNOSIS — F411 Generalized anxiety disorder: Secondary | ICD-10-CM | POA: Diagnosis not present

## 2021-05-24 DIAGNOSIS — Z79899 Other long term (current) drug therapy: Secondary | ICD-10-CM | POA: Diagnosis not present

## 2021-05-24 DIAGNOSIS — F320 Major depressive disorder, single episode, mild: Secondary | ICD-10-CM | POA: Diagnosis not present

## 2021-05-24 DIAGNOSIS — F32A Depression, unspecified: Secondary | ICD-10-CM

## 2021-05-24 MED ORDER — SERTRALINE HCL 100 MG PO TABS: 100.0000 mg | ORAL_TABLET | Freq: Every evening | ORAL | 2 refills | Status: DC

## 2021-05-24 MED ORDER — LORAZEPAM 0.5 MG PO TABS: 0.2500 mg | ORAL_TABLET | Freq: Three times a day (TID) | ORAL | 1 refills | Status: DC
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
## 2021-05-30 ENCOUNTER — Telehealth (HOSPITAL_COMMUNITY): Payer: No Payment, Other

## 2021-06-03 ENCOUNTER — Telehealth (HOSPITAL_COMMUNITY): Payer: Self-pay | Admitting: Psychiatry

## 2021-06-03 ENCOUNTER — Telehealth (HOSPITAL_COMMUNITY): Payer: Self-pay | Admitting: *Deleted

## 2021-06-03 NOTE — Telephone Encounter (Signed)
VM left for writer re her inablility to cut her ativan in half as recommended by provider and her pharmacy refused to do it. Called her back left a detailed message for her to buy a pill cutter from the drug store and or wait till Monday when Dr Ronne Binning returns to work and she can discuss with her changing her rx to .25 mg or changing meds as was suggested as a future plan. Will make Dr Ronne Binning aware of the concern.

## 2021-06-06 NOTE — Telephone Encounter (Signed)
Provider spoke to patient and informed her that a pill cutter may be effective for her.  Provider gave patient resources as to where she could purchase a pill cutter.  Medications not adjusted today.  No other concerns at this time.

## 2021-06-06 NOTE — Telephone Encounter (Signed)
Patient notes that she has been unable to cut her pills in half.  Provider asked patient if she has a pill cutter and she notes that she does not.  Provider recommended patient get a pill cutter which she was agreeable to.  Provider gave patient several resources to different pill cutters which she was grateful for.  She informed Probation officer that she continues to be nervous most days and tearful.  She notes that she is concerned because she will be starting a new job soon and wants to be successful however she notes that she too would like to get off of Ativan.  She informed Probation officer that she has no support and is concerned about her mental health wellbeing.  No medication adjustments made today.  Patient reports that she will purchase a pill cutter to help manage her medications more.  No other concerns at this time.

## 2021-06-14 ENCOUNTER — Telehealth (HOSPITAL_COMMUNITY): Payer: Self-pay | Admitting: Psychiatry

## 2021-06-14 NOTE — Telephone Encounter (Signed)
Provider spoke to patient who notes that she was unable to cut her Ativan in half.  She notes that she is having increased anxiety attacks.  Patient also notes that she may be having side effects of Zoloft.  She informed Probation officer that her eyes are red and swollen.  Provider informed patient to follow-up with her PCP and her ophthalmologist prior to reducing Zoloft.  She endorsed understanding and agreed.  Provider informed patient that Ativan 0.5 mg twice daily can be started instead of 0.25 mg twice daily.  She endorsed understanding and agreed.  Provider informed patient to call writer when she needs a refill on medications.  She endorsed understanding and agreed.  No other concerns noted at this time.

## 2021-06-27 ENCOUNTER — Telehealth (HOSPITAL_COMMUNITY): Payer: Self-pay | Admitting: *Deleted

## 2021-06-27 ENCOUNTER — Other Ambulatory Visit (HOSPITAL_COMMUNITY): Payer: Self-pay | Admitting: Psychiatry

## 2021-06-27 DIAGNOSIS — F411 Generalized anxiety disorder: Secondary | ICD-10-CM

## 2021-06-27 DIAGNOSIS — Z79899 Other long term (current) drug therapy: Secondary | ICD-10-CM

## 2021-06-27 MED ORDER — LORAZEPAM 0.5 MG PO TABS: 0.5000 mg | ORAL_TABLET | Freq: Three times a day (TID) | ORAL | 0 refills | Status: DC

## 2021-06-27 NOTE — Telephone Encounter (Signed)
Patient LVM Requested Rx Refill : LORazepam (ATIVAN) 0.5 MG tablet Stated she needs 60 tablets??

## 2021-06-27 NOTE — Telephone Encounter (Signed)
Medication refilled and sent to preferred pharmacy

## 2021-06-29 ENCOUNTER — Telehealth (HOSPITAL_COMMUNITY): Payer: Self-pay | Admitting: *Deleted

## 2021-06-29 NOTE — Telephone Encounter (Signed)
Opened in error

## 2021-07-25 ENCOUNTER — Other Ambulatory Visit (HOSPITAL_COMMUNITY): Payer: Self-pay | Admitting: Psychiatry

## 2021-07-25 ENCOUNTER — Telehealth (HOSPITAL_COMMUNITY): Payer: Self-pay | Admitting: *Deleted

## 2021-07-25 DIAGNOSIS — F411 Generalized anxiety disorder: Secondary | ICD-10-CM

## 2021-07-25 DIAGNOSIS — Z79899 Other long term (current) drug therapy: Secondary | ICD-10-CM

## 2021-07-25 MED ORDER — LORAZEPAM 0.5 MG PO TABS: 0.5000 mg | ORAL_TABLET | Freq: Two times a day (BID) | ORAL | 0 refills | Status: DC

## 2021-07-25 NOTE — Telephone Encounter (Signed)
Medication refilled and sent to preferred pharmacy.

## 2021-07-25 NOTE — Telephone Encounter (Signed)
PATIENT LVM REQUESTING REFILL ON  LORazepam (ATIVAN) 0.5 MG tablet

## 2021-08-15 ENCOUNTER — Ambulatory Visit (HOSPITAL_COMMUNITY): Payer: No Payment, Other | Admitting: Licensed Clinical Social Worker

## 2021-08-24 ENCOUNTER — Telehealth (INDEPENDENT_AMBULATORY_CARE_PROVIDER_SITE_OTHER): Payer: No Payment, Other | Admitting: Psychiatry

## 2021-08-24 ENCOUNTER — Encounter (HOSPITAL_COMMUNITY): Payer: Self-pay | Admitting: Psychiatry

## 2021-08-24 DIAGNOSIS — Z79899 Other long term (current) drug therapy: Secondary | ICD-10-CM

## 2021-08-24 DIAGNOSIS — F32A Depression, unspecified: Secondary | ICD-10-CM | POA: Diagnosis not present

## 2021-08-24 DIAGNOSIS — F411 Generalized anxiety disorder: Secondary | ICD-10-CM | POA: Diagnosis not present

## 2021-08-24 MED ORDER — LORAZEPAM 0.5 MG PO TABS
0.5000 mg | ORAL_TABLET | Freq: Two times a day (BID) | ORAL | 0 refills | Status: DC
Start: 2021-08-24 — End: 2021-09-27

## 2021-08-24 MED ORDER — SERTRALINE HCL 50 MG PO TABS: 100.0000 mg | ORAL_TABLET | Freq: Every evening | ORAL | 2 refills | Status: DC

## 2021-08-24 NOTE — Progress Notes (Signed)
BH MD/PA/NP OP Progress Note Virtual Visit via Video Note  I connected with Lacey Leon on 08/24/21 at  2:00 PM EST by a video enabled telemedicine application and verified that I am speaking with the correct person using two identifiers.  Location: Patient: Home Provider: Clinic   I discussed the limitations of evaluation and management by telemedicine and the availability of in person appointments. The patient expressed understanding and agreed to proceed.  I provided 30 minutes of non-face-to-face time during this encounter.   08/24/2021 2:41 PM Lacey Leon  MRN:  [id number]  Chief Complaint: "things are the same"  HPI:. 60 year old female seen today for follow-up psychiatric evaluation. She has a psychiatric history of depression and anxiety. She is currently managed on Zoloft 100 mg and Ativan 0.5 mg twice daily. They are somewhat affective at managing her conditions.    Today her exam was done virtually, however her camera was turned. During exam she was pleasant, cooperative, and engaged in conversation. She notes that since her last visit things have been pretty much the same.  She did Artist that she started a new job but her hours were recently reduced which has been a stressor. She reports her depression "comes and goes" and she continues to struggle with isolation. [place] where these is much conflict which weighs on her heavily. Patient states she would like to do counseling but has trust issues. She is also concerned about the cost of services although provider reassured her of financial assistance.   Patient notes that her Ativan dose is at the minimal and informed writer that she ultimately would like to be off of Ativan however notes that she cannot do it without doing the [employer].  This method includes discontinuing 1 benzo and swapping it out with another.  Provider informed patient that due to her current dependency this method would not  be utilized.  She notes that if she does not take her Ativan at a specific times a day she begins to have withdrawal symptoms noting that she has muscle cramps.  Provider discussed GeneSight testing however patient notes that she is not interested.  At this time a GAD-7 and PHQ-9 was not conducted however can be done at her next assessment.  She does endorse adequate sleep and appetite.  Today she denies SI/HI/VAH, mania, paranoia.  Patient notes that exercise helps with her depression however she informed writer that she is not financially able to afford it.  She notes that she lacks motivation to do things that she once enjoyed.  Provider recommended increasing Zoloft or switching to another antidepressant however patient notes that she is not interested.  She informed Probation officer that she would like to be off Zoloft in the near future.  She notes that she would like to continue her dose of 100 mg in the winter and then taper down over time.  She requested provider fill two 50 mg tablets instead of 1 100 mg tablet which provider was agreeable to.  No other concerns at this time.      Visit Diagnosis:    ICD-10-CM   1. Mild depression  F32.A sertraline (ZOLOFT) 50 MG tablet    2. Generalized anxiety disorder  F41.1 LORazepam (ATIVAN) 0.5 MG tablet    3. Chronic prescription benzodiazepine use  Z79.899 LORazepam (ATIVAN) 0.5 MG tablet      Past Psychiatric History: Anxiety and depression  Past Medical History:  Past Medical History:  Diagnosis Date  Anxiety    Arthritis    Chronic prescription benzodiazepine use 8/33/8250   Complication of anesthesia    unable to void for several days after anesthesia   Familial hyperlipidemia 06/23/2019   Excellent HDL but LDL 197 06/2019; recommended statin   GERD (gastroesophageal reflux disease)    Major depression, recurrent, chronic ([place]) 11/01/2018   On zoloft since age 63   Migraine headache 12/22/2015   Mitral valve prolapse    Reaction,  adjustment, with depressed mood, prolonged 01/07/2016   Ulcerative colitis [facility])     Past Surgical History:  Procedure Laterality Date   BREAST BIOPSY  2010   COLONOSCOPY     OPEN REDUCTION INTERNAL FIXATION (ORIF) DISTAL RADIAL FRACTURE Right 12/10/2019   Procedure: OPEN REDUCTION INTERNAL FIXATION (ORIF) DISTAL RADIAL FRACTURE RIGHT WRIST;  Surgeon: Marybelle Killings, MD;  Location: [place];  Service: Orthopedics;  Laterality: Right;   WISDOM TOOTH EXTRACTION      Family Psychiatric History: Mother depression, maternal uncle depression   Family History:  Family History  Problem Relation Age of Onset   Depression Mother    Heart disease Father     Social History:  Social History   Socioeconomic History   Marital status: Divorced    Spouse name: Not on file   Number of children: Not on file   Years of education: Not on file   Highest education level: Not on file  Occupational History   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[employer]    Employer: [employer]  Tobacco Use   Smoking status: Never   Smokeless tobacco: Never  Vaping Use   Vaping Use: Never used  Substance and Sexual Activity   Alcohol use: Never   Drug use: Never   Sexual activity: Not Currently    Birth control/protection: Post-menopausal  Other Topics Concern   Not on file  Social History Narrative   Not on file   Social Determinants of Health   Financial Resource Strain: Not on file  Food Insecurity: Not on file  Transportation Needs: Not on file  Physical Activity: Not on file  Stress: Not on file  Social Connections: Not on file    Allergies:  Allergies  Allergen Reactions   Ciprofloxacin Other (See Comments)    ulcer   Clarithromycin Other (See Comments)    Ulcer    Other     ORAL ANTIBIOTICS    Metabolic Disorder Labs: Lab Results  Component Value Date   HGBA1C 5.6 11/01/2018   No results found for: PROLACTIN Lab Results  Component Value Date   CHOL 279 (H) 01/28/2020   TRIG 48.0  01/28/2020   HDL 99.90 01/28/2020   CHOLHDL 3 01/28/2020   VLDL 9.6 01/28/2020   LDLCALC 169 (H) 01/28/2020   LDLCALC 197 (H) 06/20/2019   Lab Results  Component Value Date   TSH 1.67 06/20/2019   TSH 0.98 11/01/2018    Therapeutic Level Labs: No results found for: LITHIUM No results found for: VALPROATE No components found for:  CBMZ  Current Medications: Current Outpatient Medications  Medication Sig Dispense Refill   ergocalciferol (DRISDOL) 200 MCG/ML drops Take 8,000 Units by mouth daily.     LORazepam (ATIVAN) 0.5 MG tablet Take 1 tablet (0.5 mg total) by mouth 2 (two) times daily. 60 tablet 0   predniSONE (DELTASONE) 20 MG tablet Take 2 tablets by mouth daily for 5 days 10 tablet 0   sertraline (ZOLOFT) 50 MG tablet Take 2 tablets (100 mg total)  by mouth at bedtime. 180 tablet 2   No current facility-administered medications for this visit.     Musculoskeletal: Strength & Muscle Tone:  Unable to assess due to telehealth visit [place]:  Unable to assess due to telehealth visit Patient leans: N/A  Psychiatric Specialty Exam: Review of Systems  There were no vitals taken for this visit.There is no height or weight on file to calculate BMI.  General Appearance:  Unable to assess due to telehealth visit, patient camera turned on  Eye Contact:   Unable to assess due to telehealth visit, patient camera turned on  Speech:  Clear and Coherent and Normal Rate  Volume:  Normal  Mood:  Anxious and Depressed  Affect:  Appropriate and Congruent  Thought Process:  Coherent, Goal Directed, and Linear  Orientation:  Full (Time, Place, and Person)  Thought Content: WDL and Logical   Suicidal Thoughts:  No  Homicidal Thoughts:  No  Memory:  Immediate;   Good Recent;   Good Remote;   Good  Judgement:  Good  Insight:  Good  Psychomotor Activity:   Unable to assess due to telehealth visit, patient camera turned on  Concentration:  Concentration: Good and Attention Span:  Good  Recall:  Good  Fund of Knowledge: Good  Language: Good  Akathisia:   Unable to assess due to telehealth visit, patient camera turned on  Handed:  Right  AIMS (if indicated): not done  Assets:  Communication Skills Desire for Improvement Financial Resources/Insurance Housing Leisure Time Social Support  ADL's:  Intact  Cognition: WNL  Sleep:  Good   Screenings: GAD-7    Flowsheet Row Clinical Support from 05/24/2021 in [facility] Office Visit from 11/01/2018 in [place]  Total GAD-7 Score 15 15      PHQ2-9    [place] from 05/24/2021 in [facility] Office Visit from 05/02/2021 in [facility] Office Visit from 04/06/2021 in [place] Office Visit from 11/01/2018 in [place]  PHQ-2 Total Score 4 4 0 2  PHQ-9 Total Score 9 8 -- 8      [place] from 05/24/2021 in [facility] Office Visit from 05/02/2021 in [place] No Risk No Risk        Assessment and Plan: Patient informed writer that she has symptoms of anhedonia however notes that she wants to reduce her medication over time.  She informed Probation officer that she will continue her 100 mg dose for now however notes that in the near future she would like to reduce her Zoloft 100 mg to 50 mg.  She requested having to 50 mg tablets which provider was agreeable to.  At this time Xanax continued.  Patient notes ultimately she wants to be off of Xanax and notes that the only way she will effectively taper is by utilizing the Miquel Dunn method (switching from one benzo to another) provider informed her that this method would not be utilized.  She endorsed understanding.  At this time she notes she is not interested in GeneSight  testing.  1. Mild depression  Continue- sertraline (ZOLOFT) 50 MG tablet; Take 2 tablets (100 mg total) by mouth at bedtime.  Dispense: 180 tablet; Refill: 2  2. Generalized anxiety disorder  Continue- LORazepam (ATIVAN) 0.5 MG tablet; Take 1 tablet (0.5 mg total) by  mouth 2 (two) times daily.  Dispense: 60 tablet; Refill: 0  3. Chronic prescription benzodiazepine use  Continue- LORazepam (ATIVAN) 0.5 MG tablet; Take 1 tablet (0.5 mg total) by mouth 2 (two) times daily.  Dispense: 60 tablet; Refill: 0   Follow-up in 3 months Salley Slaughter, NP 08/24/2021, 2:41 PM

## 2021-09-27 ENCOUNTER — Telehealth (HOSPITAL_COMMUNITY): Payer: Self-pay | Admitting: *Deleted

## 2021-09-27 ENCOUNTER — Other Ambulatory Visit (HOSPITAL_COMMUNITY): Payer: Self-pay | Admitting: Psychiatry

## 2021-09-27 DIAGNOSIS — F411 Generalized anxiety disorder: Secondary | ICD-10-CM

## 2021-09-27 DIAGNOSIS — Z79899 Other long term (current) drug therapy: Secondary | ICD-10-CM

## 2021-09-27 MED ORDER — LORAZEPAM 0.5 MG PO TABS: 0.5000 mg | ORAL_TABLET | Freq: Two times a day (BID) | ORAL | 0 refills | Status: DC

## 2021-09-27 NOTE — Telephone Encounter (Signed)
Message left asking for her rx for her Lorazepam. SHe should have been out on 09/23/21. She has a next appt on 11/17/20. Will ask Dr Ronne Binning to take care of her rx.

## 2021-09-27 NOTE — Telephone Encounter (Signed)
Medication refilled and sent to preferred pharmacy.

## 2021-10-24 ENCOUNTER — Telehealth (HOSPITAL_COMMUNITY): Payer: Self-pay | Admitting: *Deleted

## 2021-10-24 ENCOUNTER — Other Ambulatory Visit (HOSPITAL_COMMUNITY): Payer: Self-pay | Admitting: Psychiatry

## 2021-10-24 DIAGNOSIS — Z79899 Other long term (current) drug therapy: Secondary | ICD-10-CM

## 2021-10-24 DIAGNOSIS — F411 Generalized anxiety disorder: Secondary | ICD-10-CM

## 2021-10-24 MED ORDER — LORAZEPAM 0.5 MG PO TABS: 0.5000 mg | ORAL_TABLET | Freq: Two times a day (BID) | ORAL | 0 refills | Status: DC

## 2021-10-24 NOTE — Telephone Encounter (Signed)
Patient LVM requesting refill LORazepam (ATIVAN) 0.5 MG tablet

## 2021-10-24 NOTE — Telephone Encounter (Signed)
Medication refilled and sent to preferred pharmacy.

## 2021-11-17 ENCOUNTER — Telehealth (INDEPENDENT_AMBULATORY_CARE_PROVIDER_SITE_OTHER): Payer: No Payment, Other | Admitting: Psychiatry

## 2021-11-17 ENCOUNTER — Encounter (HOSPITAL_COMMUNITY): Payer: Self-pay | Admitting: Psychiatry

## 2021-11-17 DIAGNOSIS — F411 Generalized anxiety disorder: Secondary | ICD-10-CM

## 2021-11-17 DIAGNOSIS — F32A Depression, unspecified: Secondary | ICD-10-CM

## 2021-11-17 MED ORDER — SERTRALINE HCL 50 MG PO TABS: 100.0000 mg | ORAL_TABLET | Freq: Every evening | ORAL | 2 refills | Status: DC

## 2021-11-17 NOTE — Progress Notes (Signed)
BH MD/PA/NP OP Progress Note Virtual Visit via Video Note  I connected with Lacey Leon on 11/17/21 at  2:30 PM EST by a video enabled telemedicine application and verified that I am speaking with the correct person using two identifiers.  Location: Patient: Home Provider: Clinic   I discussed the limitations of evaluation and management by telemedicine and the availability of in person appointments. The patient expressed understanding and agreed to proceed.  I provided 30 minutes of non-face-to-face time during this encounter.   11/17/2021 2:30 PM Lacey Leon  MRN:  [id number]  Chief Complaint: "My anxiety is a little worse"  HPI:. 61 year old female seen today for follow-up psychiatric evaluation. She has a psychiatric history of depression and anxiety. She is currently managed on Zoloft 100 mg and Ativan 0.5 mg twice daily. They are somewhat effective in managing her conditions.    Today her exam was done virtually, however her camera was turned. During exam she was pleasant, cooperative, and engaged in conversation. She notes that since her last visit her anxiety has been a little worse because she lost her job.She informed Probation officer that she is interested in unemployment but have not yet applied. She notes that she is more anxious in the morning. She reports that exercise helps but notes that she is not as active as she once was. She reports that she fears walking but tries to stretch and do yoga to assist. Patient reports that she is also worried about the county of [place] (her home) as there is a lot going on there. Provider conducted a GAD 7 and patient scored a 17. Provider also conducted a PHQ 9 and patient scored a 15.   She does endorse adequate sleep and appetite.  Today she denies SI/HI/VAH, mania, paranoia.  Patient notes that she is worried about her physical health.  Provider referred patient to community health and wellness.  No medication changes made today.  Patient  agreeable to continue medication as prescribed.  Ativan not filled today as it was recently refilled.  No other concerns at this time.      Visit Diagnosis:  No diagnosis found.   Past Psychiatric History: Anxiety and depression  Past Medical History:  Past Medical History:  Diagnosis Date   Anxiety    Arthritis    Chronic prescription benzodiazepine use 05/03/3663   Complication of anesthesia    unable to void for several days after anesthesia   Familial hyperlipidemia 06/23/2019   Excellent HDL but LDL 197 06/2019; recommended statin   GERD (gastroesophageal reflux disease)    Major depression, recurrent, chronic ([place]) 11/01/2018   On zoloft since age 7   Migraine headache 12/22/2015   Mitral valve prolapse    Reaction, adjustment, with depressed mood, prolonged 01/07/2016   Ulcerative colitis ([place])     Past Surgical History:  Procedure Laterality Date   BREAST BIOPSY  2010   COLONOSCOPY     OPEN REDUCTION INTERNAL FIXATION (ORIF) DISTAL RADIAL FRACTURE Right 12/10/2019   Procedure: OPEN REDUCTION INTERNAL FIXATION (ORIF) DISTAL RADIAL FRACTURE RIGHT WRIST;  Surgeon: Marybelle Killings, MD;  Location: [place];  Service: Orthopedics;  Laterality: Right;   WISDOM TOOTH EXTRACTION      Family Psychiatric History: Mother depression, maternal uncle depression   Family History:  Family History  Problem Relation Age of Onset   Depression Mother    Heart disease Father     Social History:  Social History   Socioeconomic History  Marital status: Divorced    Spouse name: Not on file   Number of children: Not on file   Years of education: Not on file   Highest education level: Not on file  Occupational History   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[employer]    Employer: [employer]  Tobacco Use   Smoking status: Never   Smokeless tobacco: Never  Vaping Use   Vaping Use: Never used  Substance and Sexual Activity   Alcohol use: Never   Drug use: Never   Sexual activity: Not  Currently    Birth control/protection: Post-menopausal  Other Topics Concern   Not on file  Social History Narrative   Not on file   Social Determinants of Health   Financial Resource Strain: Not on file  Food Insecurity: Not on file  Transportation Needs: Not on file  Physical Activity: Not on file  Stress: Not on file  Social Connections: Not on file    Allergies:  Allergies  Allergen Reactions   Ciprofloxacin Other (See Comments)    ulcer   Clarithromycin Other (See Comments)    Ulcer    Other     ORAL ANTIBIOTICS    Metabolic Disorder Labs: Lab Results  Component Value Date   HGBA1C 5.6 11/01/2018   No results found for: PROLACTIN Lab Results  Component Value Date   CHOL 279 (H) 01/28/2020   TRIG 48.0 01/28/2020   HDL 99.90 01/28/2020   CHOLHDL 3 01/28/2020   VLDL 9.6 01/28/2020   LDLCALC 169 (H) 01/28/2020   LDLCALC 197 (H) 06/20/2019   Lab Results  Component Value Date   TSH 1.67 06/20/2019   TSH 0.98 11/01/2018    Therapeutic Level Labs: No results found for: LITHIUM No results found for: VALPROATE No components found for:  CBMZ  Current Medications: Current Outpatient Medications  Medication Sig Dispense Refill   ergocalciferol (DRISDOL) 200 MCG/ML drops Take 8,000 Units by mouth daily.     LORazepam (ATIVAN) 0.5 MG tablet Take 1 tablet (0.5 mg total) by mouth 2 (two) times daily. 60 tablet 0   predniSONE (DELTASONE) 20 MG tablet Take 2 tablets by mouth daily for 5 days 10 tablet 0   sertraline (ZOLOFT) 50 MG tablet Take 2 tablets (100 mg total) by mouth at bedtime. 180 tablet 2   No current facility-administered medications for this visit.     Musculoskeletal: Strength & Muscle Tone:  Unable to assess due to telehealth visit [place]:  Unable to assess due to telehealth visit Patient leans: N/A  Psychiatric Specialty Exam: Review of Systems  There were no vitals taken for this visit.There is no height or weight on file to  calculate BMI.  General Appearance:  Unable to assess due to telehealth visit, patient camera turned on  Eye Contact:   Unable to assess due to telehealth visit, patient camera turned on  Speech:  Clear and Coherent and Normal Rate  Volume:  Normal  Mood:  Anxious and Depressed  Affect:  Appropriate and Congruent  Thought Process:  Coherent, Goal Directed, and Linear  Orientation:  Full (Time, Place, and Person)  Thought Content: WDL and Logical   Suicidal Thoughts:  No  Homicidal Thoughts:  No  Memory:  Immediate;   Good Recent;   Good Remote;   Good  Judgement:  Good  Insight:  Good  Psychomotor Activity:   Unable to assess due to telehealth visit, patient camera turned on  Concentration:  Concentration: Good and Attention Span:  Good  Recall:  Good  Fund of Knowledge: Good  Language: Good  Akathisia:   Unable to assess due to telehealth visit, patient camera turned on  Handed:  Right  AIMS (if indicated): not done  Assets:  Communication Skills Desire for Improvement Financial Resources/Insurance Housing Leisure Time Social Support  ADL's:  Intact  Cognition: WNL  Sleep:  Good   Screenings: GAD-7    Flowsheet Row Clinical Support from 05/24/2021 in [facility] Office Visit from 11/01/2018 in [place]  Total GAD-7 Score 15 15      PHQ2-9    [place] from 05/24/2021 in [facility] Office Visit from 05/02/2021 in [facility] Office Visit from 04/06/2021 in [place] Visit from 11/01/2018 in [place]  PHQ-2 Total Score 4 4 0 2  PHQ-9 Total Score 9 8 -- 8      [place] from 05/24/2021 in [facility] Office Visit from 05/02/2021 in [place] No Risk  No Risk        Assessment and Plan: Patient informed writer that her anxiety and depression has increased since losing her job in January.  She notes that exercise helps her anxiety and depression but reports that she has not been able to commit like she wants to.  She also notes that she is worried about her physical health.  At this time Ativan not refilled as it was recently refilled.  Patient will continue Zoloft as prescribed.  She was referred to community health and wellness for physical exam.    1. Mild depression  Continue- sertraline (ZOLOFT) 50 MG tablet; Take 2 tablets (100 mg total) by mouth at bedtime.  Dispense: 180 tablet; Refill: 3  2. Generalized anxiety disorder  Continue- sertraline (ZOLOFT) 50 MG tablet; Take 2 tablets (100 mg total) by mouth at bedtime.  Dispense: 180 tablet; Refill: 3  Follow-up in 3 months Salley Slaughter, NP 11/17/2021, 2:30 PM

## 2021-11-21 ENCOUNTER — Ambulatory Visit: Payer: Self-pay | Admitting: *Deleted

## 2021-11-21 NOTE — Telephone Encounter (Signed)
Summary: Swollen ingrown toe nail   Pt has an ingrown toe nail that is swollen, on her big toe.  Best contact: [phone]   ----- Message from Erick Blinks sent at 11/21/2021 11:56 AM EST -----  Pt has an ingrown toe nail that is swollen, on her big toe.  Best contact: [phone]        Chief Complaint: left toe with ingrown nail and would like appt. But has not insurance  Symptoms: left great toe with in grown nail, red, swollen Frequency: a few weeks Pertinent Negatives: Patient denies fever, red streaks to foot , pain Disposition: [] ED /[x] Urgent Care (no appt availability in office) / [] Appointment(In office/virtual)/ []  [place] [facility]/ [x] Home Care/ [] Refused Recommended Disposition /[x]  Mobile Bus/ []  Follow-up with PCP Additional Notes:   Recommended home care or to go to [facility] walk in clinic or mobile bus on Thursday. Patient has appt for new patient status in May and requesting a call from financial assistance due to no insurance. Please advise .      Reason for Disposition  Ingrown toenail is a chronic symptom (recurrent or ongoing AND present > 4 weeks)  Answer Assessment - Initial Assessment Questions 1. LOCATION: "Which toe?"      Left great toe  2. APPEARANCE: "What does it look like?"      Red, swollen  3. ONSET: "When did it start?"      Not sure but atleast a couple of weeks  4. PAIN: "Is there any pain?" If Yes, ask: "How bad is the pain?"   (Scale 1-10; or mild, moderate, severe)     Pain  5. REDNESS: "Is there any redness of the skin?" If Yes, ask: "How much of the toe is red?"     Redness  6. OTHER SYMPTOMS: "Do you have any other symptoms?" (e.g., fever, shaking, chills, red streak up foot)     Redness , swelling  7. PREGNANCY: "Is there any chance you are pregnant?" "When was your last menstrual period?"     na  Protocols used: Toenail - Ingrown-A-AH

## 2021-11-22 NOTE — Telephone Encounter (Signed)
Patient need OV for evaluation

## 2021-11-23 ENCOUNTER — Telehealth: Payer: Self-pay | Admitting: Family Medicine

## 2021-11-23 NOTE — Telephone Encounter (Signed)
Patient called wanting to figure out total self pay amount for 11/25/21 ov with dr Jonni Sanger. Patient was provided with an estimate of $92.- per Maddy we can bill this patient and she can call [facility] billing department and ask for a payment plan when bill is received.

## 2021-11-24 ENCOUNTER — Ambulatory Visit: Payer: Self-pay | Admitting: Family

## 2021-11-25 ENCOUNTER — Telehealth (HOSPITAL_COMMUNITY): Payer: Self-pay | Admitting: Psychiatry

## 2021-11-25 ENCOUNTER — Ambulatory Visit (INDEPENDENT_AMBULATORY_CARE_PROVIDER_SITE_OTHER): Payer: Self-pay | Admitting: Family

## 2021-11-25 ENCOUNTER — Encounter: Payer: Self-pay | Admitting: Family

## 2021-11-25 ENCOUNTER — Other Ambulatory Visit: Payer: Self-pay

## 2021-11-25 VITALS — BP 112/80 | HR 104 | Temp 98.2°F | Ht 67.0 in | Wt 126.6 lb

## 2021-11-25 DIAGNOSIS — M79675 Pain in left toe(s): Secondary | ICD-10-CM

## 2021-11-25 NOTE — Telephone Encounter (Signed)
Patient called requesting refill Lorazepam to preferred pharmacy on file.

## 2021-11-25 NOTE — Progress Notes (Signed)
Subjective:     Patient ID: Lacey Leon, female    DOB: [birth year], 61 y.o.   MRN: [id number]  Chief Complaint  Patient presents with   Nail Problem    Noticing 1-2 months ago, she complains of Left big toe pain. She thinks it may be an ingrown toenail. She has been using cotton to separate it from rubbing.     HPI: Toenail pain:  left big toe, left lower corner, tender to touch, mild swelling, redness, denies any drainage. Denies toenail lifting from skin or cutting into skin distally.     There are no preventive care reminders to display for this patient.  Past Medical History:  Diagnosis Date   Anxiety    Arthritis    Chronic prescription benzodiazepine use 0/26/3785   Complication of anesthesia    unable to void for several days after anesthesia   Familial hyperlipidemia 06/23/2019   Excellent HDL but LDL 197 06/2019; recommended statin   GERD (gastroesophageal reflux disease)    Major depression, recurrent, chronic ([place]) 11/01/2018   On zoloft since age 78   Migraine headache 12/22/2015   Mitral valve prolapse    Reaction, adjustment, with depressed mood, prolonged 01/07/2016   Ulcerative colitis [facility])     Past Surgical History:  Procedure Laterality Date   BREAST BIOPSY  2010   COLONOSCOPY     OPEN REDUCTION INTERNAL FIXATION (ORIF) DISTAL RADIAL FRACTURE Right 12/10/2019   Procedure: OPEN REDUCTION INTERNAL FIXATION (ORIF) DISTAL RADIAL FRACTURE RIGHT WRIST;  Surgeon: Marybelle Killings, MD;  Location: [place];  Service: Orthopedics;  Laterality: Right;   WISDOM TOOTH EXTRACTION      Outpatient Medications Prior to Visit  Medication Sig Dispense Refill   ergocalciferol (DRISDOL) 200 MCG/ML drops Take 8,000 Units by mouth daily.     LORazepam (ATIVAN) 0.5 MG tablet Take 1 tablet (0.5 mg total) by mouth 2 (two) times daily. 60 tablet 0   sertraline (ZOLOFT) 50 MG tablet Take 2 tablets (100 mg total) by mouth at bedtime. 180 tablet 2   predniSONE (DELTASONE) 20 MG tablet  Take 2 tablets by mouth daily for 5 days (Patient not taking: Reported on 11/25/2021) 10 tablet 0   No facility-administered medications prior to visit.    Allergies  Allergen Reactions   Ciprofloxacin Other (See Comments)    ulcer   Clarithromycin Other (See Comments)    Ulcer    Other     ORAL ANTIBIOTICS        Objective:    Physical Exam Vitals and nursing note reviewed.  Constitutional:      Appearance: Normal appearance.  Cardiovascular:     Rate and Rhythm: Normal rate and regular rhythm.  Pulmonary:     Effort: Pulmonary effort is normal.     Breath sounds: Normal breath sounds.  Musculoskeletal:        General: Normal range of motion.       Feet:  Feet:     Left foot:     Skin integrity: Erythema present. No blister, skin breakdown, warmth or dry skin.     Toenail Condition: Left toenails are normal.  Skin:    General: Skin is warm and dry.  Neurological:     Mental Status: She is alert.  Psychiatric:        Mood and Affect: Mood normal.        Behavior: Behavior normal.    BP 112/80    Pulse (!) 104  Temp 98.2 F (36.8 C) (Temporal)    Ht 5' 7"  (1.702 m)    Wt 126 lb 9.6 oz (57.4 kg)    SpO2 99%    BMI 19.83 kg/m  Wt Readings from Last 3 Encounters:  11/25/21 126 lb 9.6 oz (57.4 kg)  04/06/21 122 lb (55.3 kg)  10/26/20 129 lb 2 oz (58.6 kg)       Assessment & Plan:   Problem List Items Addressed This Visit   None Visit Diagnoses     Toe pain, left    -  Primary  left proximal corner of big toe, advised pt to soak in very warm water with 1-2 cups of epsom salt for 20 minutes 2-3 times per day. Dry toe well and apply a very small amount of the bacitracin ointment to area of pain bid.

## 2021-11-25 NOTE — Patient Instructions (Addendum)
It was very nice to see you today!  Soak your foot (big toe) in very warm water with 1-2 cups of epsom salt for 20 minutes 2-3 times per day.  Dry your toe well and apply a very small amount of the bacitracin ointment (can use a Qtip) and rub in gently to the lower corner of your toe where it is sore.     PLEASE NOTE:  If you had any lab tests please let us know if you have not heard back within a few days. You may see your results on MyChart before we have a chance to review them but we will give you a call once they are reviewed by [place]. If we ordered any referrals today, please let us know if you have not heard from their office within the next week.   Please try these tips to maintain a healthy lifestyle:  Eat most of your calories during the day when you are active. Eliminate processed foods including packaged sweets (pies, cakes, cookies), reduce intake of potatoes, white bread, white pasta, and white rice. Look for whole grain options, oat flour or almond flour.  Each meal should contain half fruits/vegetables, one quarter protein, and one quarter carbs (no bigger than a computer mouse).  Cut down on sweet beverages. This includes juice, soda, and sweet tea. Also watch fruit intake, though this is a healthier sweet option, it still contains natural sugar! Limit to 3 servings daily.  Drink at least 1 glass of water with each meal and aim for at least 8 glasses per day  Exercise at least 150 minutes every week.

## 2021-11-28 ENCOUNTER — Other Ambulatory Visit (HOSPITAL_COMMUNITY): Payer: Self-pay | Admitting: Psychiatry

## 2021-11-28 DIAGNOSIS — Z79899 Other long term (current) drug therapy: Secondary | ICD-10-CM

## 2021-11-28 DIAGNOSIS — F411 Generalized anxiety disorder: Secondary | ICD-10-CM

## 2021-11-28 MED ORDER — LORAZEPAM 0.5 MG PO TABS: 0.5000 mg | ORAL_TABLET | Freq: Two times a day (BID) | ORAL | 2 refills | Status: DC

## 2021-11-28 NOTE — Telephone Encounter (Signed)
Medication refilled and sent to preferred pharmacy.

## 2021-12-01 ENCOUNTER — Telehealth: Payer: Self-pay

## 2021-12-01 ENCOUNTER — Other Ambulatory Visit: Payer: Self-pay

## 2021-12-01 DIAGNOSIS — M79675 Pain in left toe(s): Secondary | ICD-10-CM

## 2021-12-01 NOTE — Telephone Encounter (Signed)
Referral sent to Podiatry.

## 2021-12-01 NOTE — Telephone Encounter (Signed)
Patient state when she last seen [facility], she was informed to call back if her toe was not getting any better for a referral to a podiatrist.   Patient is requesting referral.  Please follow up in regard.

## 2021-12-27 ENCOUNTER — Telehealth (HOSPITAL_COMMUNITY): Payer: No Payment, Other | Admitting: Psychiatry

## 2022-01-25 ENCOUNTER — Telehealth (HOSPITAL_COMMUNITY): Payer: Self-pay | Admitting: Psychiatry

## 2022-01-25 NOTE — Telephone Encounter (Signed)
REFILL:  LORAZEPAM 0.5 MG take 1 by mouth 2 times dialy Last written 11/28/21. 2 Refills Will need new prescription 01/26/22

## 2022-01-26 ENCOUNTER — Other Ambulatory Visit (HOSPITAL_COMMUNITY): Payer: Self-pay | Admitting: Physician Assistant

## 2022-01-26 DIAGNOSIS — F32A Depression, unspecified: Secondary | ICD-10-CM

## 2022-01-26 DIAGNOSIS — Z79899 Other long term (current) drug therapy: Secondary | ICD-10-CM

## 2022-01-26 DIAGNOSIS — F411 Generalized anxiety disorder: Secondary | ICD-10-CM

## 2022-01-26 MED ORDER — LORAZEPAM 0.5 MG PO TABS: 0.5000 mg | ORAL_TABLET | Freq: Two times a day (BID) | ORAL | 1 refills | Status: DC

## 2022-01-26 NOTE — Progress Notes (Signed)
Provider was contacted by Arloa Koh regarding patient's request for medications to be refilled. Per MAR review, patient was last prescribed Lorazepam on 11/28/2021 as well as 2 additional refills. Patient should run out of Lorazepam prescription by May 21st, 2023.  Patient's next lorazepam refill to be available on May 21st, 2023 along with an additional refill. ? ?Patient sertraline was last prescribed on 11/17/2021 and was given a 44-monthsupply as well as 2 additional refills. Once patient is completely out of Sertraline refills, patient should need refills by December. Provider will hold off on refilling sertraline prescription. ? ? ?

## 2022-01-26 NOTE — Telephone Encounter (Signed)
Patient called to check status of refill request. LAST VISIT with Ronne Binning was 11/17/21. Please call pt to confirm once rx sent to [facility].

## 2022-01-27 ENCOUNTER — Other Ambulatory Visit (HOSPITAL_COMMUNITY): Payer: Self-pay | Admitting: Psychiatry

## 2022-01-31 NOTE — Telephone Encounter (Signed)
Message acknowledged and reviewed.  Patient's medication was sent to pharmacy of choice.

## 2022-02-01 ENCOUNTER — Ambulatory Visit (INDEPENDENT_AMBULATORY_CARE_PROVIDER_SITE_OTHER): Payer: No Payment, Other | Admitting: Psychiatry

## 2022-02-01 DIAGNOSIS — F32A Depression, unspecified: Secondary | ICD-10-CM

## 2022-02-01 MED ORDER — SERTRALINE HCL 50 MG PO TABS: 100.0000 mg | ORAL_TABLET | Freq: Every evening | ORAL | 0 refills | Status: DC

## 2022-02-01 NOTE — Progress Notes (Signed)
BH MD/PA/NP OP Progress Note ? ?02/01/2022 5:08 PM ?Lacey Leon  ?MRN:  [id number] ? ?Virtual Visit via Telephone Note ? ?I connected with Lacey Leon on 02/01/22 at  2:00 PM EDT by telephone and verified that I am speaking with the correct person using two identifiers. ? ?Location: ?Patient: Home ?Provider: Offsite ?  ?I discussed the limitations, risks, security and privacy concerns of performing an evaluation and management service by telephone and the availability of in person appointments. I also discussed with the patient that there may be a patient responsible charge related to this service. The patient expressed understanding and agreed to proceed. ? ?  ?I discussed the assessment and treatment plan with the patient. The patient was provided an opportunity to ask questions and all were answered. The patient agreed with the plan and demonstrated an understanding of the instructions. ?  ?The patient was advised to call back or seek an in-person evaluation if the symptoms worsen or if the condition fails to improve as anticipated. ? ?I provided 10 minutes of non-face-to-face time during this encounter. ? ? ?Lacey Grip, NP  ? ?Chief Complaint: Medication management ? ?HPI: Lacey Leon is a 61 year old female presenting to [facility] behavioral health outpatient for follow-up psychiatric evaluation.  Patient has a psychiatric history of major depressive disorder and generalized anxiety disorder.  Patient symptoms are managed with Zoloft 100 mg nightly and lorazepam 0.5 mg twice daily.  Patient reports that her symptoms are managed effectively with her medication regimen and she reports medication compliance.  Patient states that Zoloft has a main ingredient of fluoride and she believes that fluoride damages the brain.  Patient made aware that lorazepam should be used short-term.  Patient reports that she has been prescribed lorazepam for many years and she is unable to stop taking lorazepam.  Patient  reports that she would probably end up homeless if she stopped taking lorazepam.  Patient became tearful when the topic of lorazepam taper approach.  Patient made aware that lorazepam has already been refilled and she does not require refills today so her lorazepam will not be adjusted today.  Patient made aware that she will be referred to her attending psychiatric provider for future medication tapers if recommended. ? ?Patient is alert and oriented x4, anxious and willing to engage.  She denies suicidal or homicidal ideations, paranoia, delusional thought, auditory or visual hallucinations. ? ? ? ?Visit Diagnosis:  ?  ICD-10-CM   ?1. Mild depression  F32.A sertraline (ZOLOFT) 50 MG tablet  ?  ? ? ?Past Psychiatric History: Major depressive disorder and anxiety. ? ?Past Medical History:  ?Past Medical History:  ?Diagnosis Date  ? Anxiety   ? Arthritis   ? Chronic prescription benzodiazepine use 11/01/2018  ? Complication of anesthesia   ? unable to void for several days after anesthesia  ? Familial hyperlipidemia 06/23/2019  ? Excellent HDL but LDL 197 06/2019; recommended statin  ? GERD (gastroesophageal reflux disease)   ? Major depression, recurrent, chronic ([place]) 11/01/2018  ? On zoloft since age 69  ? Migraine headache 12/22/2015  ? Mitral valve prolapse   ? Reaction, adjustment, with depressed mood, prolonged 01/07/2016  ? Ulcerative colitis ([place])   ?  ?Past Surgical History:  ?Procedure Laterality Date  ? BREAST BIOPSY  2010  ? COLONOSCOPY    ? OPEN REDUCTION INTERNAL FIXATION (ORIF) DISTAL RADIAL FRACTURE Right 12/10/2019  ? Procedure: OPEN REDUCTION INTERNAL FIXATION (ORIF) DISTAL RADIAL FRACTURE RIGHT WRIST;  Surgeon: Lorin Mercy,  Thana Farr, MD;  Location: [place];  Service: Orthopedics;  Laterality: Right;  ? WISDOM TOOTH EXTRACTION    ? ? ?Family Psychiatric History:  ? ?Family History:  ?Family History  ?Problem Relation Age of Onset  ? Depression Mother   ? Heart disease Father   ? ? ?Social History:  ?Social History   ? ?Socioeconomic History  ? Marital status: Divorced  ?  Spouse name: Not on file  ? Number of children: Not on file  ? Years of education: Not on file  ? Highest education level: Not on file  ?Occupational History  ? 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[employer]  ?  Employer: [employer]  ?Tobacco Use  ? Smoking status: Never  ? Smokeless tobacco: Never  ?Vaping Use  ? Vaping Use: Never used  ?Substance and Sexual Activity  ? Alcohol use: Never  ? Drug use: Never  ? Sexual activity: Not Currently  ?  Birth control/protection: Post-menopausal  ?Other Topics Concern  ? Not on file  ?Social History Narrative  ? Not on file  ? ?Social Determinants of Health  ? ?Financial Resource Strain: Not on file  ?Food Insecurity: Not on file  ?Transportation Needs: Not on file  ?Physical Activity: Not on file  ?Stress: Not on file  ?Social Connections: Not on file  ? ? ?Allergies:  ?Allergies  ?Allergen Reactions  ? Ciprofloxacin Other (See Comments)  ?  ulcer  ? Clarithromycin Other (See Comments)  ?  Ulcer ?  ? Other   ?  ORAL ANTIBIOTICS  ? ? ?Metabolic Disorder Labs: ?Lab Results  ?Component Value Date  ? HGBA1C 5.6 11/01/2018  ? ?No results found for: PROLACTIN ?Lab Results  ?Component Value Date  ? CHOL 279 (H) 01/28/2020  ? TRIG 48.0 01/28/2020  ? HDL 99.90 01/28/2020  ? CHOLHDL 3 01/28/2020  ? VLDL 9.6 01/28/2020  ? LDLCALC 169 (H) 01/28/2020  ? LDLCALC 197 (H) 06/20/2019  ? ?Lab Results  ?Component Value Date  ? TSH 1.67 06/20/2019  ? TSH 0.98 11/01/2018  ? ? ?Therapeutic Level Labs: ?No results found for: LITHIUM ?No results found for: VALPROATE ?No components found for:  CBMZ ? ?Current Medications: ?Current Outpatient Medications  ?Medication Sig Dispense Refill  ? ergocalciferol (DRISDOL) 200 MCG/ML drops Take 8,000 Units by mouth daily.    ? [START ON 02/26/2022] LORazepam (ATIVAN) 0.5 MG tablet Take 1 tablet (0.5 mg total) by mouth 2 (two) times daily. 60 tablet 1  ? predniSONE (DELTASONE) 20 MG tablet Take 2 tablets by  mouth daily for 5 days (Patient not taking: Reported on 11/25/2021) 10 tablet 0  ? sertraline (ZOLOFT) 50 MG tablet Take 2 tablets (100 mg total) by mouth at bedtime. 60 tablet 0  ? ?No current facility-administered medications for this visit.  ? ? ? ?Musculoskeletal: ?Strength & Muscle Tone: N/A virtual visit ?Gait & Station: N/A ?Patient leans: N/A ? ?Psychiatric Specialty Exam: ?Review of Systems  ?Psychiatric/Behavioral:  Negative for hallucinations, self-injury and suicidal ideas.   ?All other systems reviewed and are negative.  ?There were no vitals taken for this visit.There is no height or weight on file to calculate BMI.  ?General Appearance: N/A  ?Eye Contact: N/A  ?Speech: Clear coherent  ?Volume: Normal  ?Mood: Irritable  ?Affect:  NA  ?Thought Process:  Goal Directed  ?Orientation:  Full (Time, Place, and Person)  ?Thought Content: Illogical   ?Suicidal Thoughts:  No  ?Homicidal Thoughts:  No  ?Memory: Good  ?Judgement: Good  ?  Insight:  Fair  ?Psychomotor Activity:  NA  ?Concentration: Good  ?Recall: Good  ?Fund of Knowledge: Good  ?Language: Good  ?Akathisia: N/A  ?Handed: Right  ?AIMS (if indicated): not done  ?Assets:  Communication Skills  ?ADL's:  Intact  ?Cognition: WNL  ?Sleep:  Good  ? ?Screenings: ?GAD-7   ? ?Flowsheet Row Video Visit from 11/17/2021 in [place] from 05/24/2021 in [facility] Office Visit from 11/01/2018 in [place]  ?Total GAD-7 Score 17 15 15   ? ?  ? ?PHQ2-9   ? ?Flowsheet Row Video Visit from 11/17/2021 in [place] from 05/24/2021 in [facility] Office Visit from 05/02/2021 in [facility] Office Visit from 04/06/2021 in [place] Visit from 11/01/2018 in [place]  ?PHQ-2 Total Score 5 4  4  0 2  ?PHQ-9 Total Score 15 9 8  -- 8  ? ?  ? ?Flowsheet Row Clinical Support from 05/24/2021 in [facility] Office Visit from 05/02/2021 in [place]

## 2022-02-13 IMAGING — DX DG FOREARM 2V*R*
2 series · 2 of 2 positions shown · non-contrast
Comparison: None.

CLINICAL DATA: Pain and fall

EXAM:
RIGHT FOREARM - 2 VIEW

[forearm ap]
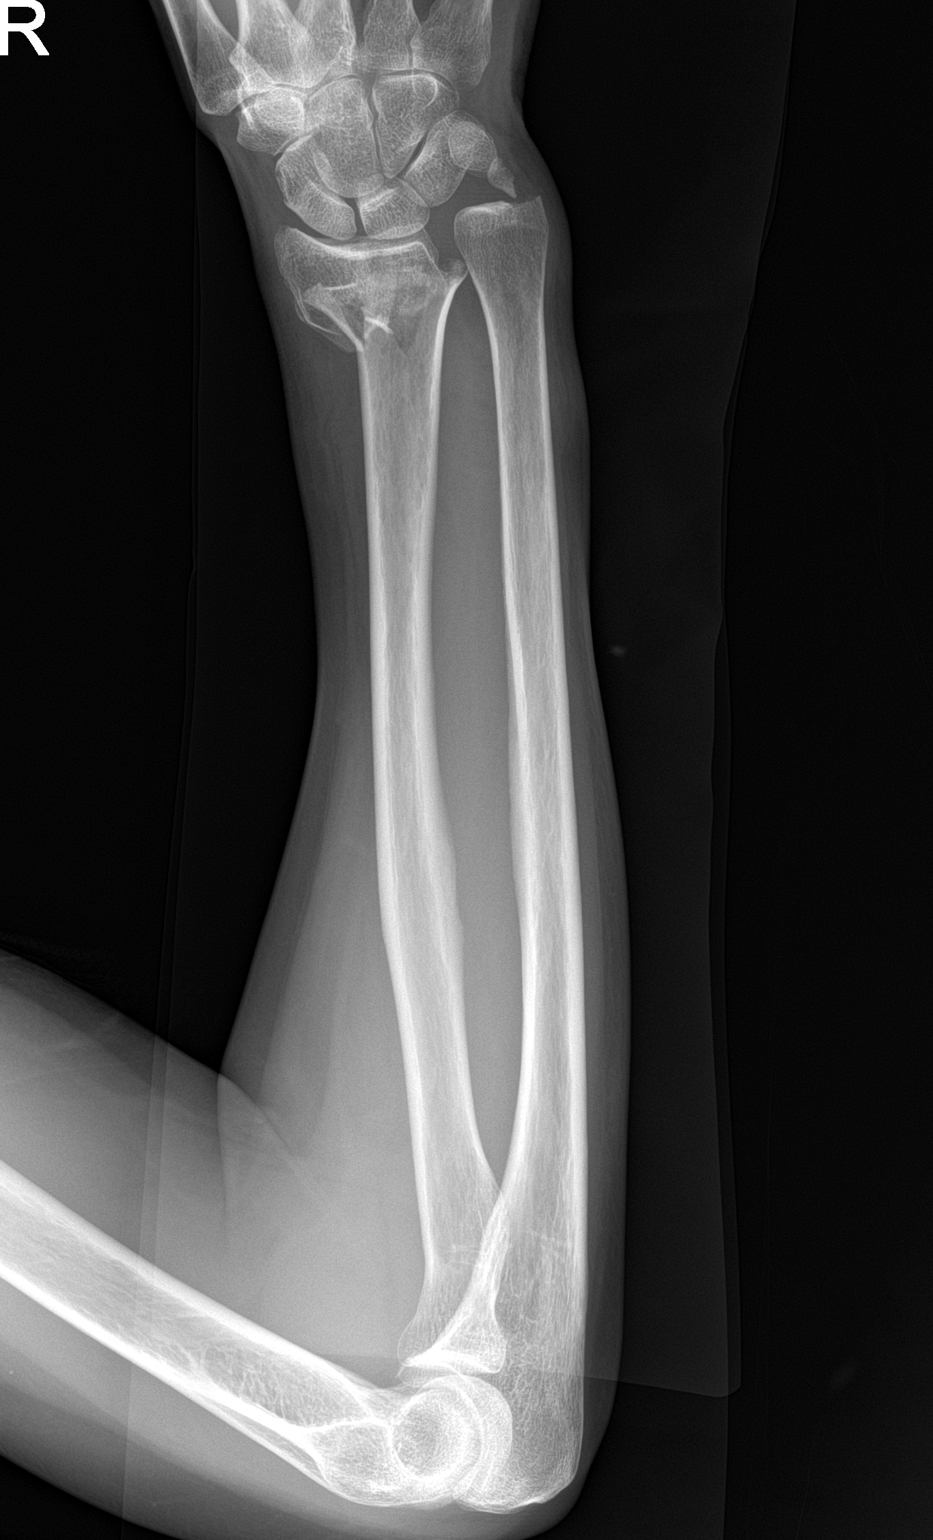

[forearm lat]
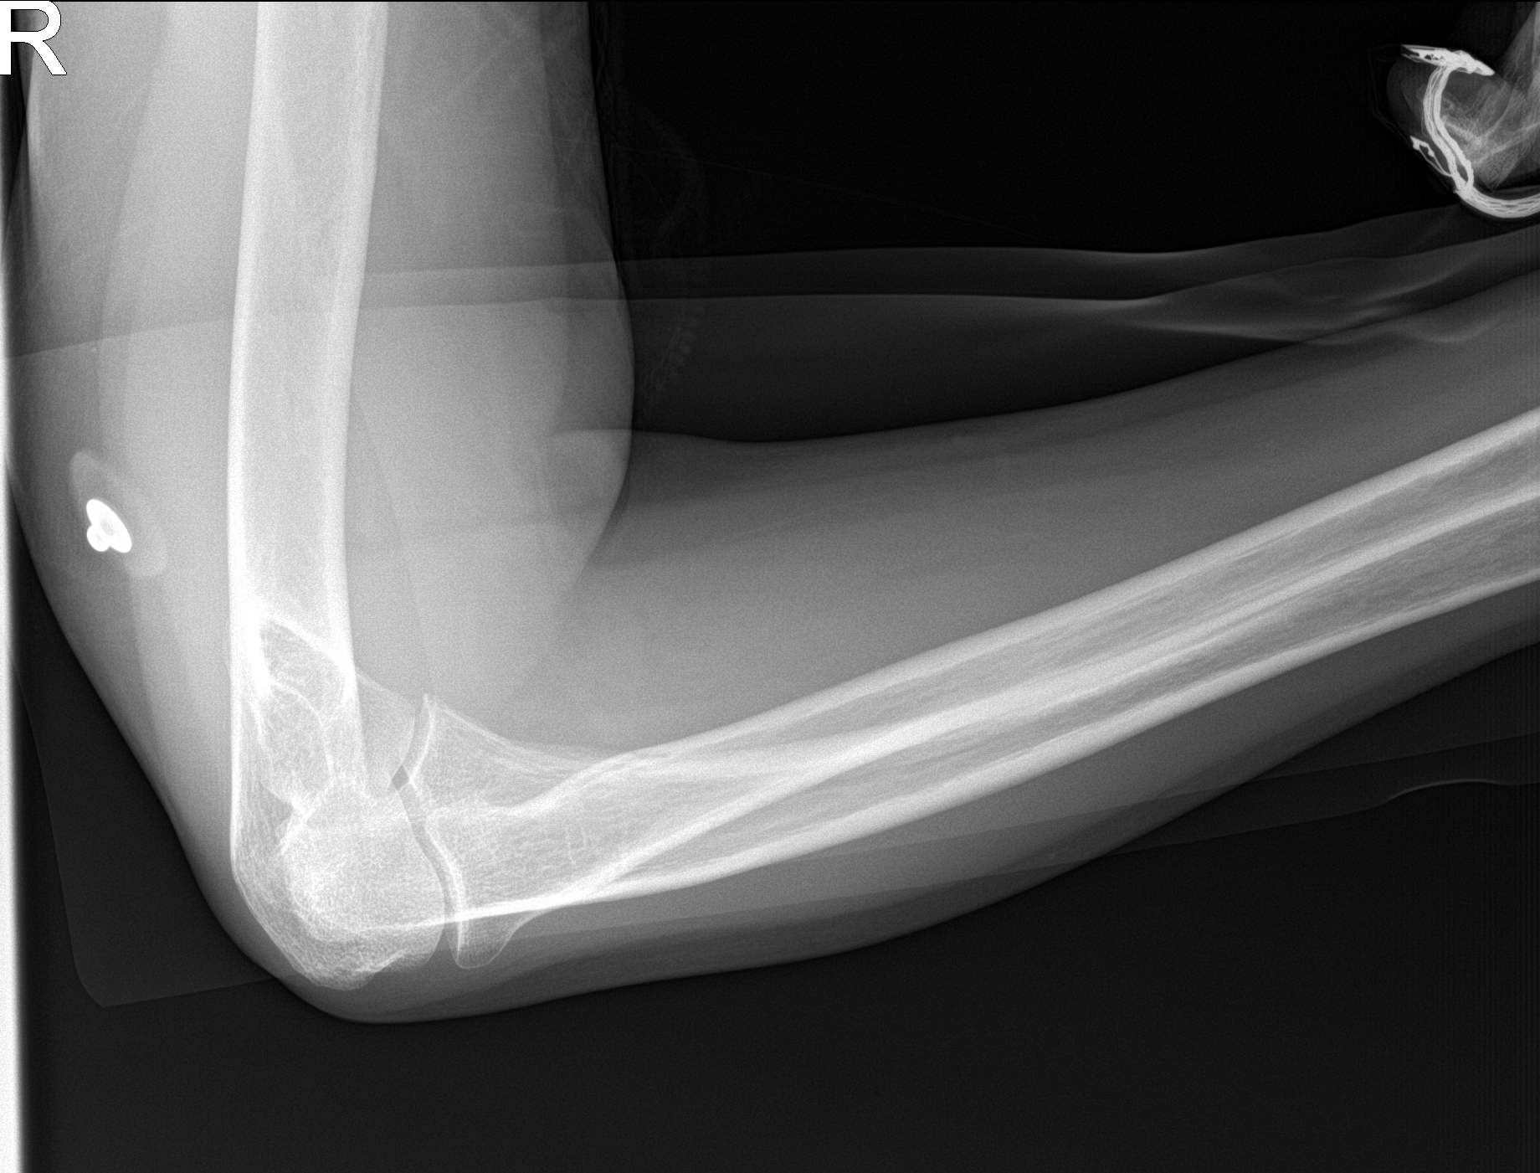

[2 of 2 positions shown; findings below may reference images not displayed]

FINDINGS: Comminuted mildly displaced impacted distal radius fracture seen.
Mildly displaced ulnar styloid fracture is noted.
IMPRESSION: Comminuted impacted mildly displaced distal radius and ulnar styloid
fractures.

## 2022-02-22 ENCOUNTER — Telehealth (HOSPITAL_COMMUNITY): Payer: Self-pay | Admitting: *Deleted

## 2022-02-22 NOTE — Telephone Encounter (Signed)
Call from patient requesting her ativan be called in. Reviewed chart, rx in for 02/26/22 which is 5 days from now and by a provider that doesn't see her. Patient is unaware of this rx.There is not a note in to explain the rx. Writer called her pharmacy and they last filled ativan for her on 01/28/22, earliest she can fill it per law is on the 19th. She has a refill available from the rx sent in by [facility] but dates [employer], pharmacy shows origination date of 01/27/22 and with a refill. Will not authorize early release, she will be able to pick it up on the 19th. ?

## 2022-02-26 NOTE — Progress Notes (Incomplete)
   New Patient Office Visit  Subjective    Patient ID: Lacey Leon, female    DOB: 1961-10-02  Age: 61 y.o. MRN: [id number]  CC: No chief complaint on file.   HPI Lacey Leon presents to establish care ***  Outpatient Encounter Medications as of 02/27/2022  Medication Sig  . ergocalciferol (DRISDOL) 200 MCG/ML drops Take 8,000 Units by mouth daily.  Marland Kitchen LORazepam (ATIVAN) 0.5 MG tablet Take 1 tablet (0.5 mg total) by mouth 2 (two) times daily.  . predniSONE (DELTASONE) 20 MG tablet Take 2 tablets by mouth daily for 5 days (Patient not taking: Reported on 11/25/2021)  . sertraline (ZOLOFT) 50 MG tablet Take 2 tablets (100 mg total) by mouth at bedtime.   No facility-administered encounter medications on file as of 02/27/2022.    Past Medical History:  Diagnosis Date  . Anxiety   . Arthritis   . Chronic prescription benzodiazepine use 11/01/2018  . Complication of anesthesia    unable to void for several days after anesthesia  . Familial hyperlipidemia 06/23/2019   Excellent HDL but LDL 197 06/2019; recommended statin  . GERD (gastroesophageal reflux disease)   . Major depression, recurrent, chronic ([place]) 11/01/2018   On zoloft since age 76  . Migraine headache 12/22/2015  . Mitral valve prolapse   . Reaction, adjustment, with depressed mood, prolonged 01/07/2016  . Ulcerative colitis [facility])     Past Surgical History:  Procedure Laterality Date  . BREAST BIOPSY  2010  . COLONOSCOPY    . OPEN REDUCTION INTERNAL FIXATION (ORIF) DISTAL RADIAL FRACTURE Right 12/10/2019   Procedure: OPEN REDUCTION INTERNAL FIXATION (ORIF) DISTAL RADIAL FRACTURE RIGHT WRIST;  Surgeon: Marybelle Killings, MD;  Location: [place];  Service: Orthopedics;  Laterality: Right;  . WISDOM TOOTH EXTRACTION      Family History  Problem Relation Age of Onset  . Depression Mother   . Heart disease Father     Social History   Socioeconomic History  . Marital status: Divorced    Spouse name: Not on file  .  Number of children: Not on file  . Years of education: Not on file  . Highest education level: Not on file  Occupational History  . 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[employer]    Employer: [employer]  Tobacco Use  . Smoking status: Never  . Smokeless tobacco: Never  Vaping Use  . Vaping Use: Never used  Substance and Sexual Activity  . Alcohol use: Never  . Drug use: Never  . Sexual activity: Not Currently    Birth control/protection: Post-menopausal  Other Topics Concern  . Not on file  Social History Narrative  . Not on file   Social Determinants of Health   Financial Resource Strain: Not on file  Food Insecurity: Not on file  Transportation Needs: Not on file  Physical Activity: Not on file  Stress: Not on file  Social Connections: Not on file  Intimate Partner Violence: Not on file    ROS      Objective    There were no vitals taken for this visit.  Physical Exam  {Labs (Optional):23779}    Assessment & Plan:   Problem List Items Addressed This Visit   None   No follow-ups on file.   Asencion Noble, MD

## 2022-02-27 ENCOUNTER — Ambulatory Visit: Payer: Self-pay | Admitting: Critical Care Medicine

## 2022-03-15 ENCOUNTER — Ambulatory Visit (INDEPENDENT_AMBULATORY_CARE_PROVIDER_SITE_OTHER): Payer: No Payment, Other | Admitting: Psychiatry

## 2022-03-15 ENCOUNTER — Encounter (HOSPITAL_COMMUNITY): Payer: Self-pay | Admitting: Psychiatry

## 2022-03-15 DIAGNOSIS — F411 Generalized anxiety disorder: Secondary | ICD-10-CM | POA: Diagnosis not present

## 2022-03-15 DIAGNOSIS — Z79899 Other long term (current) drug therapy: Secondary | ICD-10-CM

## 2022-03-15 DIAGNOSIS — F32A Depression, unspecified: Secondary | ICD-10-CM | POA: Diagnosis not present

## 2022-03-15 MED ORDER — LORAZEPAM 0.5 MG PO TABS: 0.7500 mg | ORAL_TABLET | Freq: Every day | ORAL | 1 refills | Status: AC

## 2022-03-15 MED ORDER — MELATONIN 10 MG PO TABS: 10.0000 mg | ORAL_TABLET | Freq: Every day | ORAL | 3 refills | Status: AC | PRN

## 2022-03-15 MED ORDER — SERTRALINE HCL 50 MG PO TABS: 100.0000 mg | ORAL_TABLET | Freq: Every evening | ORAL | 3 refills | Status: AC

## 2022-03-15 NOTE — Progress Notes (Signed)
BH MD/PA/NP OP Progress Note Virtual Visit via Telephone Note  I connected with Lacey Leon on 03/15/22 at  2:30 PM EDT by telephone and verified that I am speaking with the correct person using two identifiers.  Location: Patient: home Provider: Clinic   I discussed the limitations, risks, security and privacy concerns of performing an evaluation and management service by telephone and the availability of in person appointments. I also discussed with the patient that there may be a patient responsible charge related to this service. The patient expressed understanding and agreed to proceed.   I provided 30 minutes of non-face-to-face time during this encounter.    03/15/2022 3:22 PM Lacey Leon  MRN:  [id number]  Chief Complaint: "Im terrified of stopping lorazepam"  HPI:. 61 year old female seen today for follow-up psychiatric evaluation. She has a psychiatric history of depression and anxiety. She is currently managed on Zoloft 100 mg and Ativan 0.5 mg twice daily.  Writer that she reduce Zoloft to 75 mg and notes that her medications are somewhat effective in managing her conditions.    Today able to logon virtually so her assessment was done over the phone.  During exam she was pleasant, cooperative, and engaged in conversation. She notes that she is terrified stop lorazepam.  She notes that she has been on it for over 30 years.  Provider informed patient that taking benzos for extended.  It is not recommended and informed her that her dose will be reduced today.  Patient became very tearful but notes that she was willing to try.  Provider unable to conduct a GAD-7 and PHQ-9 as patient became very overwhelmed.  She did note that her sleep has been reduced due to family.  She endorses adequate appetite and denies SI/HI/VAH, mania, paranoia.   Ativan reduced from 0.5 twice daily to 0.75 daily.  Medication sent to [facility] who notes that they would cut pill in half for patient.   To start melatonin 10 mg nightly to help him sleep.  Potential side effects of medication and risks vs benefits of treatment vs non-treatment were explained and discussed. All questions were answered. Patient agreeable to increase Zoloft to 100 mg.  No other concerns at this time.       Visit Diagnosis:    ICD-10-CM   1. Mild depression  F32.A sertraline (ZOLOFT) 50 MG tablet    Melatonin 10 MG TABS    2. Generalized anxiety disorder  F41.1 LORazepam (ATIVAN) 0.5 MG tablet    3. Chronic prescription benzodiazepine use  Z79.899 LORazepam (ATIVAN) 0.5 MG tablet       Past Psychiatric History: Anxiety and depression  Past Medical History:  Past Medical History:  Diagnosis Date   Anxiety    Arthritis    Chronic prescription benzodiazepine use 7/78/2423   Complication of anesthesia    unable to void for several days after anesthesia   Familial hyperlipidemia 06/23/2019   Excellent HDL but LDL 197 06/2019; recommended statin   GERD (gastroesophageal reflux disease)    Major depression, recurrent, chronic ([place]) 11/01/2018   On zoloft since age 61   Migraine headache 12/22/2015   Mitral valve prolapse    Reaction, adjustment, with depressed mood, prolonged 01/07/2016   Ulcerative colitis [facility])     Past Surgical History:  Procedure Laterality Date   BREAST BIOPSY  2010   COLONOSCOPY     OPEN REDUCTION INTERNAL FIXATION (ORIF) DISTAL RADIAL FRACTURE Right 12/10/2019   Procedure: OPEN REDUCTION INTERNAL  FIXATION (ORIF) DISTAL RADIAL FRACTURE RIGHT WRIST;  Surgeon: Marybelle Killings, MD;  Location: [facility];  Service: Orthopedics;  Laterality: Right;   WISDOM TOOTH EXTRACTION      Family Psychiatric History: Mother depression, maternal uncle depression   Family History:  Family History  Problem Relation Age of Onset   Depression Mother    Heart disease Father     Social History:  Social History   Socioeconomic History   Marital status: Divorced    Spouse name: Not on file   Number  of children: Not on file   Years of education: Not on file   Highest education level: Not on file  Occupational History   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[employer]    Employer: [employer]  Tobacco Use   Smoking status: Never   Smokeless tobacco: Never  Vaping Use   Vaping Use: Never used  Substance and Sexual Activity   Alcohol use: Never   Drug use: Never   Sexual activity: Not Currently    Birth control/protection: Post-menopausal  Other Topics Concern   Not on file  Social History Narrative   Not on file   Social Determinants of Health   Financial Resource Strain: Not on file  Food Insecurity: Not on file  Transportation Needs: Not on file  Physical Activity: Not on file  Stress: Not on file  Social Connections: Not on file    Allergies:  Allergies  Allergen Reactions   Ciprofloxacin Other (See Comments)    ulcer   Clarithromycin Other (See Comments)    Ulcer    Other     ORAL ANTIBIOTICS    Metabolic Disorder Labs: Lab Results  Component Value Date   HGBA1C 5.6 11/01/2018   No results found for: PROLACTIN Lab Results  Component Value Date   CHOL 279 (H) 01/28/2020   TRIG 48.0 01/28/2020   HDL 99.90 01/28/2020   CHOLHDL 3 01/28/2020   VLDL 9.6 01/28/2020   LDLCALC 169 (H) 01/28/2020   LDLCALC 197 (H) 06/20/2019   Lab Results  Component Value Date   TSH 1.67 06/20/2019   TSH 0.98 11/01/2018    Therapeutic Level Labs: No results found for: LITHIUM No results found for: VALPROATE No components found for:  CBMZ  Current Medications: Current Outpatient Medications  Medication Sig Dispense Refill   LORazepam (ATIVAN) 0.5 MG tablet Take 1.5 tablets (0.75 mg total) by mouth daily. 45 tablet 1   Melatonin 10 MG TABS Take 10 mg by mouth daily as needed. 30 tablet 3   ergocalciferol (DRISDOL) 200 MCG/ML drops Take 8,000 Units by mouth daily.     predniSONE (DELTASONE) 20 MG tablet Take 2 tablets by mouth daily for 5 days (Patient not taking: Reported on  11/25/2021) 10 tablet 0   sertraline (ZOLOFT) 50 MG tablet Take 2 tablets (100 mg total) by mouth at bedtime. 60 tablet 3   No current facility-administered medications for this visit.     Musculoskeletal: Strength & Muscle Tone:  Unable to assess due to telehphone visit [place]:  Unable to assess due to telephone visit Patient leans: N/A  Psychiatric Specialty Exam: Review of Systems  There were no vitals taken for this visit.There is no height or weight on file to calculate BMI.  General Appearance:  Unable to assess due to telephone visit,   Eye Contact:   Unable to assess due to telephonevisit,  Speech:  Clear and Coherent and Normal Rate  Volume:  Normal  Mood:  Anxious and Depressed  Affect:  Appropriate, Congruent, and Tearful  Thought Process:  Coherent, Goal Directed, and Linear  Orientation:  Full (Time, Place, and Person)  Thought Content: WDL and Logical   Suicidal Thoughts:  No  Homicidal Thoughts:  No  Memory:  Immediate;   Good Recent;   Good Remote;   Good  Judgement:  Good  Insight:  Good  Psychomotor Activity:   Unable to assess due to telephone visit,   Concentration:  Concentration: Good and Attention Span: Good  Recall:  Good  Fund of Knowledge: Good  Language: Good  Akathisia:   Unable to assess due to telephone visit,  Handed:  Right  AIMS (if indicated): not done  Assets:  Communication Skills Desire for Improvement Financial Resources/Insurance Housing Leisure Time Social Support  ADL's:  Intact  Cognition: WNL  Sleep:  Fair   Screenings: GAD-7    Flowsheet Row Video Visit from 11/17/2021 in [facility] from 05/24/2021 in [facility] Office Visit from 11/01/2018 in [facility]  Total GAD-7 Score 17 15 15       PHQ2-9    [place] Video Visit from 11/17/2021 in [facility]  from 05/24/2021 in [facility] Office Visit from 05/02/2021 in [facility] Office Visit from 04/06/2021 in [facility] Visit from 11/01/2018 in [facility]  PHQ-2 Total Score 5 4 4  0 2  PHQ-9 Total Score 15 9 8  -- 8      Flowsheet Row Clinical Support from 05/24/2021 in [facility] Office Visit from 05/02/2021 in [facility] No Risk No Risk        Assessment and Plan: Symptoms of anxiety due to life stressors and reduction of Ativan.  She also notes that her sleep has been poor.Ativan reduced from 0.5 twice daily to 0.75 daily.  Medication sent to [facility] who notes that they would cut pill in half for patient.  To start melatonin 10 mg nightly to help him sleep.  .mkPatient agreeable to increase Zoloft to 100 mg.  No other concerns at this time.  1. Mild depression Increased- sertraline (ZOLOFT) 50 MG tablet; Take 2 tablets (100 mg total) by mouth at bedtime.  Dispense: 60 tablet; Refill: 3 Start- Melatonin 10 MG TABS; Take 10 mg by mouth daily as needed.  Dispense: 30 tablet; Refill: 3  2. Generalized anxiety disorder  reduced- LORazepam (ATIVAN) 0.5 MG tablet; Take 1.5 tablets (0.75 mg total) by mouth daily.  Dispense: 45 tablet; Refill: 1  3. Chronic prescription benzodiazepine use  reduced- LORazepam (ATIVAN) 0.5 MG tablet; Take 1.5 tablets (0.75 mg total) by mouth daily.  Dispense: 45 tablet; Refill: 1   Follow-up in 3 months Salley Slaughter, NP 03/15/2022, 3:22 PM

## 2022-06-14 ENCOUNTER — Encounter (HOSPITAL_COMMUNITY): Payer: Self-pay

## 2022-06-14 ENCOUNTER — Telehealth (HOSPITAL_COMMUNITY): Payer: No Payment, Other | Admitting: Psychiatry

## 2022-06-15 ENCOUNTER — Telehealth (HOSPITAL_COMMUNITY): Payer: Self-pay | Admitting: *Deleted

## 2022-06-15 NOTE — Telephone Encounter (Signed)
Thank you for the update. Medication will be reduced/discontinued at patients next visit. If patient is receiving medication from another provider medication will be discontinued.

## 2022-06-15 NOTE — Telephone Encounter (Signed)
Call from pharmacy to ask if the provider will be reducing patients ativan dose in the future, difficult to cut the pill and she had been bubble packing but patient asking for is to be put in two different bottles. Checked with Brittney and patient missed her recent appt so no refills will be given at this time. Called Almyra Free back at [facility] to tell her the plan is to further decrease her ativan but we did not put in rx for it as she didn't come for her appt, julie checked further and the rx she is filling is from a Dr Helene Kelp in [facility] which suggest Dr shopping. Will notify Brittney of this information.

## 2024-08-04 ENCOUNTER — Encounter (HOSPITAL_BASED_OUTPATIENT_CLINIC_OR_DEPARTMENT_OTHER): Payer: Self-pay | Admitting: Internal Medicine

## 2024-08-04 ENCOUNTER — Ambulatory Visit (HOSPITAL_BASED_OUTPATIENT_CLINIC_OR_DEPARTMENT_OTHER): Admitting: Internal Medicine

## 2024-08-04 VITALS — BP 98/54 | HR 103 | Ht 67.0 in | Wt 117.5 lb

## 2024-08-04 DIAGNOSIS — I341 Nonrheumatic mitral (valve) prolapse: Secondary | ICD-10-CM

## 2024-08-04 DIAGNOSIS — E7849 Other hyperlipidemia: Secondary | ICD-10-CM

## 2024-08-04 NOTE — Progress Notes (Unsigned)
 LIPID CLINIC CONSULT NOTE  Chief Complaint:  Manage dyslipidemia  Primary Care Physician: Leon, Marisa, DO  Primary Cardiologist:  None  HPI:  Lacey Leon is a 63 y.o. female who is being seen today for the evaluation of dyslipidemia at the request of Leon, Marisa, DO.  This is a pleasant 63 year old female kindly referred for evaluation management of dyslipidemia.  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  She does have family history of heart disease including her father who died of an MI at age 43.  She has a history of dyslipidemia.  Her recent cholesterol was quite high with total 300, HDL 114, triglycerides 107 and LDL 184.  Subsequently in May her total cholesterol come down to 248 with an LDL of 136.  Unfortunately she has been uninterested in statin medications after having had some intolerance in the past.  She was referred for other considerations.  Historically she was diagnosed with a familial hyperlipidemia with an LDL of 197.  She also has a history of very high HDL cholesterol but was unaware that this may not be cardioprotective.  Also of note, she says she has a history of mitral valve prolapse.  She did have an echo in 2020 by her primary care provider which showed some mild mitral valve prolapse but this has not been reassessed.  She seems asymptomatic and denies any palpitations although does have chronic anxiety.  PMHx:  Past Medical History:  Diagnosis Date   Anxiety    Arthritis    Chronic prescription benzodiazepine use 11/01/2018   Complication of anesthesia    unable to void for several days after anesthesia   Familial hyperlipidemia 06/23/2019   Excellent HDL but LDL 197 06/2019; recommended statin   GERD (gastroesophageal reflux disease)    Major depression, recurrent, chronic 11/01/2018   On zoloft  since age 86   Migraine headache 12/22/2015   Mitral valve prolapse    Reaction, adjustment, with depressed mood, prolonged  01/07/2016   Ulcerative colitis [facility])     Past Surgical History:  Procedure Laterality Date   BREAST BIOPSY  2010   COLONOSCOPY     OPEN REDUCTION INTERNAL FIXATION (ORIF) DISTAL RADIAL FRACTURE Right 12/10/2019   Procedure: OPEN REDUCTION INTERNAL FIXATION (ORIF) DISTAL RADIAL FRACTURE RIGHT WRIST;  Surgeon: Barbarann Oneil BROCKS, MD;  Location: [facility] OR;  Service: Orthopedics;  Laterality: Right;   WISDOM TOOTH EXTRACTION      FAMHx:  Family History  Problem Relation Age of Onset   Depression Mother    Heart disease Father     SOCHx:   reports that she has never smoked. She has never used smokeless tobacco. She reports that she does not drink alcohol and does not use drugs.  ALLERGIES:  Allergies  Allergen Reactions   Bacitracin     Other Reaction(s): Ulcerative Colitis flare with Oral Antibiotics   Ciprofloxacin Other (See Comments)    ulcer   Clarithromycin Other (See Comments)    Ulcer    Other     ORAL ANTIBIOTICS    ROS: Pertinent items noted in HPI and remainder of comprehensive ROS otherwise negative.  HOME MEDS: Current Outpatient Medications on File Prior to Visit  Medication Sig Dispense Refill   LORazepam  (ATIVAN ) 0.5 MG tablet Take 1.5 tablets (0.75 mg total) by mouth daily. 45 tablet 1   sertraline  (ZOLOFT ) 50 MG tablet Take 2 tablets (100 mg total) by mouth at bedtime. (Patient  taking differently: Take 50 mg by mouth at bedtime.) 60 tablet 3   ergocalciferol  (DRISDOL) 200 MCG/ML drops Take 8,000 Units by mouth daily. (Patient not taking: Reported on 08/04/2024)     Melatonin 10 MG TABS Take 10 mg by mouth daily as needed. (Patient not taking: Reported on 08/04/2024) 30 tablet 3   predniSONE  (DELTASONE ) 20 MG tablet Take 2 tablets by mouth daily for 5 days (Patient not taking: Reported on 08/04/2024) 10 tablet 0   No current facility-administered medications on file prior to visit.    LABS/IMAGING: No results found for this or any previous visit (from the past 48  hours). No results found.  LIPID PANEL:    Component Value Date/Time   CHOL 279 (H) 01/28/2020 1347   TRIG 48.0 01/28/2020 1347   HDL 99.90 01/28/2020 1347   CHOLHDL 3 01/28/2020 1347   VLDL 9.6 01/28/2020 1347   LDLCALC 169 (H) 01/28/2020 1347    No results found for: LIPOA   WEIGHTS: Wt Readings from Last 3 Encounters:  08/04/24 117 lb 8 oz (53.3 kg)  11/25/21 126 lb 9.6 oz (57.4 kg)  04/06/21 122 lb (55.3 kg)    VITALS: BP (!) 98/54   Pulse (!) 103   Ht 5' 7 (1.702 m)   Wt 117 lb 8 oz (53.3 kg)   SpO2 97%   BMI 18.40 kg/m   EXAM: General appearance: alert, no distress, and thin Heart: regular rate and rhythm, S1, S2 normal, and mid systolic click present  EKG: Deferred  ASSESSMENT: Probable familial hyperlipidemia with LDL greater than 190 in the past and high HDL cholesterol Family history of heart disease in her first-degree relative Statin averse Mitral valve prolapse  PLAN: 1.   [name] has a probable familial hyperlipidemia with LDL that has been quite high in the past but more recently improved.  She is not currently on any lipid-lowering therapy.  She says she wants to better qualify her risk.  Particularly because of her high HDL cholesterol she wonders if she has larger and lower risk particles.  Will therefore proceed with an NMR particle test, LP(a) and high-sensitivity CRP per her request.  I also offered calcium scoring however she wanted to consider that.  Genetic testing may also be of benefit to demonstrate whether or not her high HDL cholesterol is cardioprotective or not.  She wanted to consider that as well mostly due to cost issues.  Will reach out to her with results on this.  Finally, she has a history of mitral valve prolapse but has not had repeat imaging since 2020.  Will order repeat echo to evaluate this.  Thanks for the kind referral.  Vinie KYM Maxcy, MD, [facility], FNLA, FACP  [place]  [facility]  Medical Director of the  Advanced Lipid Disorders &  Cardiovascular Risk Reduction Clinic Diplomate of the American Board of Clinical Lipidology Attending Cardiologist  Direct Dial: [phone]  Fax: [phone]  Website:  [email or web address]  Vinie BROCKS Demarkus Remmel 08/04/2024, 2:57 PM

## 2024-08-04 NOTE — Patient Instructions (Signed)
 Medication Instructions:  No changes *If you need a refill on your cardiac medications before your next appointment, please call your pharmacy*  Lab Work: Today: blood work: High sensitivity CRP, NMR lipoprofile, Lpa  If you have labs (blood work) drawn today and your tests are completely normal, you will receive your results only by: MyChart Message (if you have MyChart) OR A paper copy in the mail If you have any lab test that is abnormal or we need to change your treatment, we will call you to review the results.  Testing/Procedures: none  Follow-Up: As needed

## 2024-08-05 ENCOUNTER — Encounter (HOSPITAL_BASED_OUTPATIENT_CLINIC_OR_DEPARTMENT_OTHER): Payer: Self-pay | Admitting: Internal Medicine

## 2024-08-19 ENCOUNTER — Institutional Professional Consult (permissible substitution) (HOSPITAL_BASED_OUTPATIENT_CLINIC_OR_DEPARTMENT_OTHER): Payer: Self-pay | Admitting: Internal Medicine

## 2024-09-11 ENCOUNTER — Other Ambulatory Visit (HOSPITAL_BASED_OUTPATIENT_CLINIC_OR_DEPARTMENT_OTHER)

## 2024-10-15 ENCOUNTER — Other Ambulatory Visit (HOSPITAL_BASED_OUTPATIENT_CLINIC_OR_DEPARTMENT_OTHER)

## 2024-11-27 ENCOUNTER — Other Ambulatory Visit (HOSPITAL_BASED_OUTPATIENT_CLINIC_OR_DEPARTMENT_OTHER)
